# Patient Record
Sex: Male | Born: 1995 | Race: Black or African American | Hispanic: No | Marital: Single | Smoking: Never smoker
Health system: Southern US, Community
[De-identification: ages and names within clinical notes are randomized; demographics above are authoritative.]

## PROBLEM LIST (undated history)

## (undated) DIAGNOSIS — J45909 Unspecified asthma, uncomplicated: Secondary | ICD-10-CM

---

## 2018-03-20 ENCOUNTER — Ambulatory Visit: Payer: Self-pay | Admitting: Emergency Medicine

## 2018-06-19 ENCOUNTER — Telehealth: Payer: Medicaid Other | Admitting: Family

## 2018-06-19 DIAGNOSIS — J45909 Unspecified asthma, uncomplicated: Secondary | ICD-10-CM

## 2018-06-19 DIAGNOSIS — B9789 Other viral agents as the cause of diseases classified elsewhere: Secondary | ICD-10-CM

## 2018-06-19 DIAGNOSIS — J069 Acute upper respiratory infection, unspecified: Secondary | ICD-10-CM

## 2018-06-19 MED ORDER — BENZONATATE 100 MG PO CAPS: 100.0000 mg | ORAL_CAPSULE | Freq: Three times a day (TID) | ORAL | 0 refills | Status: DC | PRN

## 2018-06-19 MED ORDER — PREDNISONE 10 MG (21) PO TBPK: ORAL_TABLET | ORAL | 0 refills | Status: DC

## 2018-06-19 NOTE — Progress Notes (Signed)
We are sorry that you are not feeling well.  Here is how we plan to help!  I called patient to discuss SOB. States he has asthma and is using his inhaler that helps. He does not have a fever.   Approximately 5 minutes was spent documenting and reviewing patient's chart.    Based on your presentation I believe you most likely have A cough due to a virus.  This is called viral bronchitis and is best treated by rest, plenty of fluids and control of the cough.  You may use Ibuprofen or Tylenol as directed to help your symptoms.     In addition you may use A non-prescription cough medication called Robitussin DAC. Take 2 teaspoons every 8 hours or Delsym: take 2 teaspoons every 12 hours., A non-prescription cough medication called Mucinex DM: take 2 tablets every 12 hours. and A prescription cough medication called Tessalon Perles 100mg . You may take 1-2 capsules every 8 hours as needed for your cough.  Prednisone 10 mg daily for 6 days (see taper instructions below)    From your responses in the eVisit questionnaire you describe inflammation in the upper respiratory tract which is causing a significant cough.  This is commonly called Bronchitis and has four common causes:    Allergies  Viral Infections  Acid Reflux  Bacterial Infection Allergies, viruses and acid reflux are treated by controlling symptoms or eliminating the cause. An example might be a cough caused by taking certain blood pressure medications. You stop the cough by changing the medication. Another example might be a cough caused by acid reflux. Controlling the reflux helps control the cough.  USE OF BRONCHODILATOR ("RESCUE") INHALERS: There is a risk from using your bronchodilator too frequently.  The risk is that over-reliance on a medication which only relaxes the muscles surrounding the breathing tubes can reduce the effectiveness of medications prescribed to reduce swelling and congestion of the tubes themselves.  Although  you feel brief relief from the bronchodilator inhaler, your asthma may actually be worsening with the tubes becoming more swollen and filled with mucus.  This can delay other crucial treatments, such as oral steroid medications. If you need to use a bronchodilator inhaler daily, several times per day, you should discuss this with your provider.  There are probably better treatments that could be used to keep your asthma under control.     HOME CARE . Only take medications as instructed by your medical team. . Complete the entire course of an antibiotic. . Drink plenty of fluids and get plenty of rest. . Avoid close contacts especially the very young and the elderly . Cover your mouth if you cough or cough into your sleeve. . Always remember to wash your hands . A steam or ultrasonic humidifier can help congestion.   GET HELP RIGHT AWAY IF: . You develop worsening fever. . You become short of breath . You cough up blood. . Your symptoms persist after you have completed your treatment plan MAKE SURE YOU   Understand these instructions.  Will watch your condition.  Will get help right away if you are not doing well or get worse.  Your e-visit answers were reviewed by a board certified advanced clinical practitioner to complete your personal care plan.  Depending on the condition, your plan could have included both over the counter or prescription medications. If there is a problem please reply  once you have received a response from your provider. Your safety is important to  [place].  If you have drug allergies check your prescription carefully.    You can use MyChart to ask questions about today's visit, request a non-urgent call back, or ask for a work or school excuse for 24 hours related to this e-Visit. If it has been greater than 24 hours you will need to follow up with your provider, or enter a new e-Visit to address those concerns. You will get an e-mail in the next two days asking about  your experience.  I hope that your e-visit has been valuable and will speed your recovery. Thank you for using e-visits.

## 2018-06-20 ENCOUNTER — Telehealth: Payer: Self-pay | Admitting: Family

## 2018-06-20 DIAGNOSIS — R0600 Dyspnea, unspecified: Secondary | ICD-10-CM

## 2018-06-20 NOTE — Progress Notes (Signed)
Based on what you shared with me, I feel your condition warrants further evaluation and I recommend that you be seen for a face to face office visit.     NOTE: If you entered your credit card information for this eVisit, you will not be charged. You may see a "hold" on your card for the $35 but that hold will drop off and you will not have a charge processed.  If you are having a true medical emergency please call 911.  If you need an urgent face to face visit, [place] [facility] has four urgent care centers for your convenience.    PLEASE NOTE: THE [facility] LOC[facility] AND URGENT CARE CLINICS DO NOT HAVE THE TESTING FOR CORONAVIRUS COVID19 AVAILABLE.  IF YOU FEEL YOU NEED THIS TEST YOU MUST HAVE AN ORDER TO GO TO A TESTING LOCATION FROM YOUR PROVIDER OR FROM A SCREENING E-VISIT     [email or web address] to reserve your spot online an avoid wait times  [facility] [address], [address] [place], [place] [address] 8 am to 8 pm Monday-Friday 10 am to 4 pm Saturday-Sunday *Across the street from [employer]  [facility]  [address] [place], [address] 8 am to 5 pm Monday-Friday * In the [facility] on the [facility]   The following sites will take your insurance:  . [facility]  [phone] Get Driving Directions Find a Provider at this Location  [address] [place], [place] [address] . 10 am to 8 pm Monday-Friday . 12 pm to 8 pm Saturday-Sunday   . [facility]  [phone] Get Driving Directions Find a Provider at this Location  [address], [address] [place], [place] [address] . 8 am to 8 pm Monday-Friday . 9 am to 6 pm Saturday . 11 am to 6 pm Sunday   .  [facility] at [facility]  [phone] Get Driving Directions  [address] [place], [place] [address] . 8 am to 8 pm Monday-Friday . 8 am to 4 pm Saturday-Sunday   Your  e-visit answers were reviewed by a board certified advanced clinical practitioner to complete your personal care plan.  Thank you for using e-Visits. 

## 2018-06-25 ENCOUNTER — Ambulatory Visit: Payer: Self-pay | Admitting: Internal Medicine

## 2019-09-15 ENCOUNTER — Ambulatory Visit: Payer: Self-pay | Attending: Internal Medicine

## 2019-09-15 DIAGNOSIS — Z23 Encounter for immunization: Secondary | ICD-10-CM

## 2019-09-15 NOTE — Progress Notes (Signed)
   Covid-19 Vaccination Clinic  Name:  Geronimo Diliberto    MRN: [id number] DOB: 1996-03-20  09/15/2019  Mr. Jun was observed post Covid-19 immunization for 15 minutes without incident. He was provided with Vaccine Information Sheet and instruction to access the V-Safe system.   Mr. Panebianco was instructed to call 911 with any severe reactions post vaccine: Marland Kitchen Difficulty breathing  . Swelling of face and throat  . A fast heartbeat  . A bad rash all over body  . Dizziness and weakness   Immunizations Administered    Name Date Dose VIS Date Route   Pfizer COVID-19 Vaccine 09/15/2019 10:08 AM 0.3 mL 05/28/2018 Intramuscular   Manufacturer: [employer], [employer]   Lot: [id number]   NDC: [address]-3

## 2019-10-08 ENCOUNTER — Other Ambulatory Visit: Payer: Self-pay

## 2019-10-08 ENCOUNTER — Ambulatory Visit
Admission: RE | Admit: 2019-10-08 | Discharge: 2019-10-08 | Disposition: A | Discharge status: Home / Self Care | Payer: 59 | Source: Ambulatory Visit | Attending: Emergency Medicine | Admitting: Emergency Medicine

## 2019-10-08 ENCOUNTER — Inpatient Hospital Stay
Admission: RE | Admit: 2019-10-08 | Discharge: 2019-10-08 | Disposition: A | Discharge status: Home / Self Care | Payer: Medicaid Other | Source: Ambulatory Visit

## 2019-10-08 ENCOUNTER — Ambulatory Visit (INDEPENDENT_AMBULATORY_CARE_PROVIDER_SITE_OTHER): Payer: 59

## 2019-10-08 VITALS — BP 151/107 | HR 88 | Temp 100.3°F | Resp 18

## 2019-10-08 DIAGNOSIS — R05 Cough: Secondary | ICD-10-CM

## 2019-10-08 DIAGNOSIS — J069 Acute upper respiratory infection, unspecified: Secondary | ICD-10-CM | POA: Diagnosis not present

## 2019-10-08 DIAGNOSIS — J4521 Mild intermittent asthma with (acute) exacerbation: Secondary | ICD-10-CM | POA: Diagnosis not present

## 2019-10-08 HISTORY — DX: Unspecified asthma, uncomplicated: J45.909

## 2019-10-08 LAB — POC SARS CORONAVIRUS 2 AG -  ED: SARS Coronavirus 2 Ag: NEGATIVE

## 2019-10-08 MED ORDER — AEROCHAMBER PLUS FLO-VU MEDIUM MISC: 1.0000 | Freq: Once | 0 refills | Status: AC

## 2019-10-08 MED ORDER — BENZONATATE 100 MG PO CAPS: 100.0000 mg | ORAL_CAPSULE | Freq: Three times a day (TID) | ORAL | 0 refills | Status: DC

## 2019-10-08 MED ORDER — FLUTICASONE PROPIONATE 50 MCG/ACT NA SUSP: 1.0000 | Freq: Every day | NASAL | 0 refills | Status: DC

## 2019-10-08 MED ORDER — CETIRIZINE HCL 10 MG PO TABS: 10.0000 mg | ORAL_TABLET | Freq: Every day | ORAL | 0 refills | Status: DC

## 2019-10-08 MED ORDER — ALBUTEROL SULFATE HFA 108 (90 BASE) MCG/ACT IN AERS: 2.0000 | INHALATION_SPRAY | RESPIRATORY_TRACT | 0 refills | Status: AC | PRN

## 2019-10-08 MED ORDER — PREDNISONE 50 MG PO TABS: 50.0000 mg | ORAL_TABLET | Freq: Every day | ORAL | 0 refills | Status: DC

## 2019-10-08 NOTE — ED Triage Notes (Signed)
Pt c/o cough, nasal/chest congestion, and coughing up small amounts of BRB x10 x3 days

## 2019-10-08 NOTE — Discharge Instructions (Signed)

## 2019-10-08 NOTE — ED Provider Notes (Signed)
[facility]    CSN: [id number] Arrival date & time: 10/08/19  1141      History   Chief Complaint Chief Complaint  Patient presents with  . Cough    HPI Wayne Bird is a 24 y.o. male with history of asthma presenting for 3-day course of URI symptoms.  Patient provides history: Endorsing cough, nasal and chest congestion.  States he is coughed up a small amount of sputum: Yellow/clear with streaks of blood.  Denies gross hemoptysis, chest pain, palpitations.  No fever, thrashes, myalgias.  No known sick contacts.  Patient has been fully vaccinated for most 2 weeks now.  Has noticed increase in wheezing, for which he uses his albuterol inhaler: Denies ShOB.   Past Medical History:  Diagnosis Date  . Asthma     There are no problems to display for this patient.   History reviewed. No pertinent surgical history.     Home Medications    Prior to Admission medications   Medication Sig Start Date End Date Taking? Authorizing Provider  albuterol (VENTOLIN HFA) 108 (90 Base) MCG/ACT inhaler Inhale 2 puffs into the lungs every 4 (four) hours as needed for wheezing or shortness of breath. 10/08/19   Hall-Potvin, [place], PA-C  benzonatate (TESSALON) 100 MG capsule Take 1 capsule (100 mg total) by mouth every 8 (eight) hours. 10/08/19   Hall-Potvin, [place], PA-C  cetirizine (ZYRTEC ALLERGY) 10 MG tablet Take 1 tablet (10 mg total) by mouth daily. 10/08/19   Hall-Potvin, [place], PA-C  fluticasone (FLONASE) 50 MCG/ACT nasal spray Place 1 spray into both nostrils daily. 10/08/19   Hall-Potvin, [place], PA-C  predniSONE (DELTASONE) 50 MG tablet Take 1 tablet (50 mg total) by mouth daily with breakfast. 10/08/19   Hall-Potvin, [place], PA-C  Spacer/Aero-Holding Chambers (AEROCHAMBER PLUS FLO-VU MEDIUM) MISC 1 each by Other route once for 1 dose. 10/08/19 10/08/19  Hall-Potvin, [place], PA-C    Family History History reviewed. No pertinent family history.  Social  History Social History   Tobacco Use  . Smoking status: Never Smoker  . Smokeless tobacco: Never Used  Substance Use Topics  . Alcohol use: Not Currently  . Drug use: Not Currently     Allergies   Patient has no known allergies.   Review of Systems As per HPI   Physical Exam Triage Vital Signs ED Triage Vitals  Enc Vitals Group     BP      Pulse      Resp      Temp      Temp src      SpO2      Weight      Height      Head Circumference      Peak Flow      Pain Score      Pain Loc      Pain Edu?      Excl. in GC?    No data found.  Updated Vital Signs BP (!) 151/107 (BP Location: Left Arm)   Pulse 88   Temp 100.3 F (37.9 C) (Oral)   Resp 18   SpO2 92%   Visual Acuity Right Eye Distance:   Left Eye Distance:   Bilateral Distance:    Right Eye Near:   Left Eye Near:    Bilateral Near:     Physical Exam Constitutional:      General: He is not in acute distress.    Appearance: He is not toxic-appearing or diaphoretic.  HENT:  Head: Normocephalic and atraumatic.     Mouth/Throat:     Mouth: Mucous membranes are moist.     Pharynx: Oropharynx is clear.  Eyes:     General: No scleral icterus.    Conjunctiva/sclera: Conjunctivae normal.     Pupils: Pupils are equal, round, and reactive to light.  Neck:     Comments: Trachea midline, negative JVD Cardiovascular:     Rate and Rhythm: Normal rate and regular rhythm.  Pulmonary:     Effort: Pulmonary effort is normal. No respiratory distress.     Breath sounds: No wheezing or rales.     Comments: Decreased breath sounds bilaterally w/o prolonged expiratory phase Musculoskeletal:     Cervical back: Neck supple. No tenderness.  Lymphadenopathy:     Cervical: No cervical adenopathy.  Skin:    Capillary Refill: Capillary refill takes less than 2 seconds.     Coloration: Skin is not jaundiced or pale.     Findings: No rash.  Neurological:     Mental Status: He is alert and oriented to person,  place, and time.      UC Treatments / Results  Labs (all labs ordered are listed, but only abnormal results are displayed) Labs Reviewed  POC SARS CORONAVIRUS 2 AG -  ED - Normal  NOVEL CORONAVIRUS, NAA    EKG  Radiology DG Chest 2 View  Result Date: 10/08/2019 CLINICAL DATA:  Productive cough. EXAM: CHEST - 2 VIEW COMPARISON:  None. FINDINGS: The heart size and mediastinal contours are within normal limits. Both lungs are clear. The visualized skeletal structures are unremarkable. IMPRESSION: No active cardiopulmonary disease. Electronically Signed   By: Lupita Raider M.D.   On: 10/08/2019 12:58    Procedures Procedures (including critical care time)  Medications Ordered in UC Medications - No data to display  Initial Impression / Assessment and Plan / UC Course  I have reviewed the triage vital signs and the nursing notes.  Pertinent labs & imaging results that were available during my care of the patient were reviewed by me and considered in my medical decision making (see chart for details).     Patient afebrile, nontoxic, with SpO2 92%.  Rapid Covid negative, PCR pending.  Chest x-ray unremarkable.  Patient to quarantine until results are back.  We will treat supportively as outlined below.  Return precautions discussed, patient verbalized understanding and is agreeable to plan. Final Clinical Impressions(s) / UC Diagnoses   Final diagnoses:  URI with cough and congestion  Mild intermittent asthma with acute exacerbation     Discharge Instructions     Tessalon for cough. Start flonase, atrovent nasal spray for nasal congestion/drainage. You can use over the counter nasal saline rinse such as neti pot for nasal congestion. Keep hydrated, your urine should be clear to pale yellow in color. Tylenol/motrin for fever and pain. Monitor for any worsening of symptoms, chest pain, shortness of breath, wheezing, swelling of the throat, go to the emergency department for  further evaluation needed.     ED Prescriptions    Medication Sig Dispense Auth. Provider   albuterol (VENTOLIN HFA) 108 (90 Base) MCG/ACT inhaler Inhale 2 puffs into the lungs every 4 (four) hours as needed for wheezing or shortness of breath. 18 g Hall-Potvin, [place], PA-C   Spacer/Aero-Holding Chambers (AEROCHAMBER PLUS FLO-VU MEDIUM) MISC 1 each by Other route once for 1 dose. 1 each Hall-Potvin, [place], PA-C   fluticasone (FLONASE) 50 MCG/ACT nasal spray Place 1 spray into  both nostrils daily. 16 g Hall-Potvin, [place], PA-C   cetirizine (ZYRTEC ALLERGY) 10 MG tablet Take 1 tablet (10 mg total) by mouth daily. 30 tablet Hall-Potvin, [place], PA-C   predniSONE (DELTASONE) 50 MG tablet Take 1 tablet (50 mg total) by mouth daily with breakfast. 5 tablet Hall-Potvin, [place], PA-C   benzonatate (TESSALON) 100 MG capsule Take 1 capsule (100 mg total) by mouth every 8 (eight) hours. 21 capsule Hall-Potvin, [place], PA-C     PDMP not reviewed this encounter.   Hall-Potvin, [place], [place] 10/08/19 1407

## 2019-10-09 LAB — SARS-COV-2, NAA 2 DAY TAT

## 2019-10-09 LAB — NOVEL CORONAVIRUS, NAA: SARS-CoV-2, NAA: NOT DETECTED

## 2019-12-16 ENCOUNTER — Other Ambulatory Visit: Payer: 59

## 2021-08-24 ENCOUNTER — Observation Stay (HOSPITAL_COMMUNITY)
Admission: EM | Admit: 2021-08-24 | Discharge: 2021-08-25 | Disposition: A | Discharge status: Home / Self Care | Payer: 59 | Attending: Student | Admitting: Student

## 2021-08-24 ENCOUNTER — Other Ambulatory Visit: Payer: Self-pay

## 2021-08-24 ENCOUNTER — Emergency Department (HOSPITAL_COMMUNITY): Payer: 59

## 2021-08-24 ENCOUNTER — Encounter (HOSPITAL_COMMUNITY): Payer: Self-pay

## 2021-08-24 DIAGNOSIS — D72829 Elevated white blood cell count, unspecified: Secondary | ICD-10-CM | POA: Insufficient documentation

## 2021-08-24 DIAGNOSIS — Z91010 Allergy to peanuts: Secondary | ICD-10-CM | POA: Insufficient documentation

## 2021-08-24 DIAGNOSIS — J9601 Acute respiratory failure with hypoxia: Principal | ICD-10-CM | POA: Insufficient documentation

## 2021-08-24 DIAGNOSIS — Z79899 Other long term (current) drug therapy: Secondary | ICD-10-CM | POA: Insufficient documentation

## 2021-08-24 DIAGNOSIS — J4541 Moderate persistent asthma with (acute) exacerbation: Secondary | ICD-10-CM

## 2021-08-24 DIAGNOSIS — R739 Hyperglycemia, unspecified: Secondary | ICD-10-CM | POA: Insufficient documentation

## 2021-08-24 DIAGNOSIS — J45901 Unspecified asthma with (acute) exacerbation: Secondary | ICD-10-CM | POA: Insufficient documentation

## 2021-08-24 DIAGNOSIS — E876 Hypokalemia: Secondary | ICD-10-CM | POA: Insufficient documentation

## 2021-08-24 DIAGNOSIS — R000 Tachycardia, unspecified: Secondary | ICD-10-CM | POA: Insufficient documentation

## 2021-08-24 MED ORDER — MAGNESIUM SULFATE 2 GM/50ML IV SOLN
2.0000 g | Freq: Once | INTRAVENOUS | Status: AC
  Administered 2021-08-24: 2 g via INTRAVENOUS
  Filled 2021-08-24: qty 50

## 2021-08-24 MED ORDER — ALBUTEROL SULFATE HFA 108 (90 BASE) MCG/ACT IN AERS
2.0000 | INHALATION_SPRAY | RESPIRATORY_TRACT | Status: DC | PRN
  Administered 2021-08-24: 2 via RESPIRATORY_TRACT
  Filled 2021-08-24: qty 6.7

## 2021-08-24 MED ORDER — ALBUTEROL SULFATE (2.5 MG/3ML) 0.083% IN NEBU
10.0000 mg/h | INHALATION_SOLUTION | Freq: Once | RESPIRATORY_TRACT | Status: AC
  Administered 2021-08-24: 10 mg/h via RESPIRATORY_TRACT
  Filled 2021-08-24: qty 3

## 2021-08-24 MED ORDER — IPRATROPIUM BROMIDE 0.02 % IN SOLN
0.5000 mg | Freq: Once | RESPIRATORY_TRACT | Status: AC
  Administered 2021-08-24: 0.5 mg via RESPIRATORY_TRACT
  Filled 2021-08-24: qty 2.5

## 2021-08-24 MED ORDER — IPRATROPIUM-ALBUTEROL 0.5-2.5 (3) MG/3ML IN SOLN
6.0000 mL | Freq: Once | RESPIRATORY_TRACT | Status: AC
  Administered 2021-08-24: 6 mL via RESPIRATORY_TRACT
  Filled 2021-08-24: qty 6

## 2021-08-24 MED ORDER — METHYLPREDNISOLONE SODIUM SUCC 125 MG IJ SOLR
125.0000 mg | Freq: Once | INTRAMUSCULAR | Status: AC
  Administered 2021-08-24: 125 mg via INTRAVENOUS
  Filled 2021-08-24: qty 2

## 2021-08-24 NOTE — ED Provider Notes (Addendum)
[facility]-EMERGENCY DEPT Provider Note  CSN: [id number] Arrival date & time: 08/24/21 2056  Chief Complaint(s) Asthma, Wheezing, and Shortness of Breath  HPI Wayne Bird is a 26 y.o. male with PMH asthma who presents the emergency department for evaluation of wheezing and shortness of breath.  Patient states that he has had some mild shortness of breath in last 2 to 3 days but today his shortness of breath worsened significantly.  He used his home albuterol with a spacer with no relief.  He arrives with accessory muscle use, tachypnea and hypoxia.  Denies abdominal pain, nausea, vomiting, fever or other systemic symptoms.   Past Medical History Past Medical History:  Diagnosis Date   Asthma    There are no problems to display for this patient.  Home Medication(s) Prior to Admission medications   Medication Sig Start Date End Date Taking? Authorizing Provider  albuterol (VENTOLIN HFA) 108 (90 Base) MCG/ACT inhaler Inhale 2 puffs into the lungs every 4 (four) hours as needed for wheezing or shortness of breath. 10/08/19   Hall-Potvin, [place], PA-C  benzonatate (TESSALON) 100 MG capsule Take 1 capsule (100 mg total) by mouth every 8 (eight) hours. 10/08/19   Hall-Potvin, [place], PA-C  cetirizine (ZYRTEC ALLERGY) 10 MG tablet Take 1 tablet (10 mg total) by mouth daily. 10/08/19   Hall-Potvin, [place], PA-C  fluticasone (FLONASE) 50 MCG/ACT nasal spray Place 1 spray into both nostrils daily. 10/08/19   Hall-Potvin, [place], PA-C  predniSONE (DELTASONE) 50 MG tablet Take 1 tablet (50 mg total) by mouth daily with breakfast. 10/08/19   Hall-Potvin, [place], [place]                                                                                                                                    Past Surgical History History reviewed. No pertinent surgical history. Family History History reviewed. No pertinent family history.  Social History Social History   Tobacco Use    Smoking status: Never   Smokeless tobacco: Never  Substance Use Topics   Alcohol use: Not Currently   Drug use: Not Currently   Allergies Patient has no known allergies.  Review of Systems Review of Systems  Respiratory:  Positive for cough, shortness of breath and wheezing.    Physical Exam Vital Signs  I have reviewed the triage vital signs BP (!) 170/99   Pulse 96   Temp 98.3 F (36.8 C) (Oral)   Resp 14   SpO2 91%   Physical Exam Constitutional:      General: He is not in acute distress.    Appearance: Normal appearance.  HENT:     Head: Normocephalic and atraumatic.     Nose: No congestion or rhinorrhea.  Eyes:     General:        Right eye: No discharge.        Left eye: No discharge.     Extraocular Movements:  Extraocular movements intact.     Pupils: Pupils are equal, round, and reactive to light.  Cardiovascular:     Rate and Rhythm: Normal rate and regular rhythm.     Heart sounds: No murmur heard. Pulmonary:     Effort: Tachypnea, accessory muscle usage and respiratory distress present.     Breath sounds: Wheezing present. No rales.  Abdominal:     General: There is no distension.     Tenderness: There is no abdominal tenderness.  Musculoskeletal:        General: Normal range of motion.     Cervical back: Normal range of motion.  Skin:    General: Skin is warm and dry.  Neurological:     General: No focal deficit present.     Mental Status: He is alert.    ED Results and Treatments Labs (all labs ordered are listed, but only abnormal results are displayed) Labs Reviewed - No data to display                                                                                                                        Radiology DG Chest Portable 1 View  Result Date: 08/24/2021 CLINICAL DATA:  Dyspnea, wheezing, shortness of breath. EXAM: PORTABLE CHEST 1 VIEW COMPARISON:  10/08/2019. FINDINGS: The heart size and mediastinal contours are within normal  limits. There is hyperinflation of the lungs. Both lungs are clear. The visualized skeletal structures are unremarkable. IMPRESSION: Hyperinflation of the lungs with acute infiltrate. Electronically Signed   By: Thornell Sartorius M.D.   On: 08/24/2021 21:50    Pertinent labs & imaging results that were available during my care of the patient were reviewed by me and considered in my medical decision making (see MDM for details).  Medications Ordered in ED Medications  albuterol (VENTOLIN HFA) 108 (90 Base) MCG/ACT inhaler 2 puff (2 puffs Inhalation Given 08/24/21 2121)  magnesium sulfate IVPB 2 g 50 mL (has no administration in time range)  albuterol (PROVENTIL) (2.5 MG/3ML) 0.083% nebulizer solution (has no administration in time range)  ipratropium (ATROVENT) nebulizer solution 0.5 mg (has no administration in time range)  ipratropium-albuterol (DUONEB) 0.5-2.5 (3) MG/3ML nebulizer solution 6 mL (6 mLs Nebulization Given 08/24/21 2121)  methylPREDNISolone sodium succinate (SOLU-MEDROL) 125 mg/2 mL injection 125 mg (125 mg Intravenous Given 08/24/21 2127)  Procedures .Critical Care Performed by: Glendora ScoreKommor, Grasiela Jonsson, MD Authorized by: Glendora ScoreKommor, Eliga Arvie, MD   Critical care provider statement:    Critical care time (minutes):  30   Critical care was necessary to treat or prevent imminent or life-threatening deterioration of the following conditions:  Respiratory failure   Critical care was time spent personally by me on the following activities:  Development of treatment plan with patient or surrogate, discussions with consultants, evaluation of patient's response to treatment, examination of patient, ordering and review of laboratory studies, ordering and review of radiographic studies, ordering and performing treatments and interventions, pulse oximetry, re-evaluation of  patient's condition and review of old charts  (including critical care time)  Medical Decision Making / ED Course   This patient presents to the ED for concern of shortness of breath, wheezing this involves an extensive number of treatment options, and is a complaint that carries with it a high risk of complications and morbidity.  The differential diagnosis includes asthma exacerbation, pneumonia, aspirated foreign body  MDM: Seen in the emergency room for evaluation of shortness of breath.  Physical exam reveals patient in respiratory distress with tachypnea, accessory muscle use and wheezing.  ECG with sinus tachycardia, chest x-ray unremarkable.  Patient given 2 back-to-back DuoNebs, 125 methylprednisolone and on reevaluation patient improved but still with significant wheezing.  1 hour continuous albuterol and 1 additional Atrovent ordered and patient signed out to oncoming provider.  Magnesium also ordered.  If patient symptoms not improved after this treatment, patient will require admission for albuterol treatments every 4 hours.   Additional history obtained: -Additional history obtained from girlfriend -External records from outside source obtained and reviewed including: Chart review including previous notes, labs, imaging, consultation notes   Lab Tests: -I ordered, reviewed, and interpreted labs.   The pertinent results include:   Labs Reviewed - No data to display    EKG   EKG Interpretation  Date/Time:  Wednesday Aug 24 2021 21:12:52 EDT Ventricular Rate:  118 PR Interval:  154 QRS Duration: 84 QT Interval:  320 QTC Calculation: 451 R Axis:   73 Text Interpretation: Sinus tachycardia Confirmed by Javier Gell ([id number]) on 08/24/2021 10:37:57 PM         Imaging Studies ordered: I ordered imaging studies including chest x-ray I independently visualized and interpreted imaging. I agree with the radiologist interpretation   Medicines ordered and prescription  drug management: Meds ordered this encounter  Medications   albuterol (VENTOLIN HFA) 108 (90 Base) MCG/ACT inhaler 2 puff   ipratropium-albuterol (DUONEB) 0.5-2.5 (3) MG/3ML nebulizer solution 6 mL   methylPREDNISolone sodium succinate (SOLU-MEDROL) 125 mg/2 mL injection 125 mg    IV methylprednisolone will be converted to either a q12h or q24h frequency with the same total daily dose (TDD).  Ordered Dose: 1 to 125 mg TDD; convert to: TDD q24h.  Ordered Dose: 126 to 250 mg TDD; convert to: TDD div q12h.  Ordered Dose: >250 mg TDD; DAW.   magnesium sulfate IVPB 2 g 50 mL   albuterol (PROVENTIL) (2.5 MG/3ML) 0.083% nebulizer solution   ipratropium (ATROVENT) nebulizer solution 0.5 mg    -I have reviewed the patients home medicines and have made adjustments as needed  Critical interventions Multiple DuoNebs, oxygen supplementation, steroids    Cardiac Monitoring: The patient was maintained on a cardiac monitor.  I personally viewed and interpreted the cardiac monitored which showed an underlying rhythm of: Tachycardia  Social Determinants of Health:  Factors impacting patients care include: none  Reevaluation: After the interventions noted above, I reevaluated the patient and found that they have :improved  Co morbidities that complicate the patient evaluation  Past Medical History:  Diagnosis Date   Asthma       Dispostion: I considered admission for this patient, and disposition will be pending completion of continuous albuterol therapy, please see provider signout note for continuation of work-up     Final Clinical Impression(s) / ED Diagnoses Final diagnoses:  None     @PCDICTATION @    , MD 08/24/21 2241    08/26/21, MD 08/24/21 2336

## 2021-08-24 NOTE — ED Triage Notes (Signed)
Pt reports with wheezing and SHOB x 1 hr ago. Pt tried to use his rescue inhaler with no relief.

## 2021-08-25 DIAGNOSIS — J4541 Moderate persistent asthma with (acute) exacerbation: Secondary | ICD-10-CM

## 2021-08-25 DIAGNOSIS — J9601 Acute respiratory failure with hypoxia: Secondary | ICD-10-CM | POA: Diagnosis present

## 2021-08-25 DIAGNOSIS — J4521 Mild intermittent asthma with (acute) exacerbation: Secondary | ICD-10-CM

## 2021-08-25 DIAGNOSIS — E876 Hypokalemia: Secondary | ICD-10-CM

## 2021-08-25 DIAGNOSIS — R739 Hyperglycemia, unspecified: Secondary | ICD-10-CM

## 2021-08-25 DIAGNOSIS — R030 Elevated blood-pressure reading, without diagnosis of hypertension: Secondary | ICD-10-CM

## 2021-08-25 DIAGNOSIS — J45901 Unspecified asthma with (acute) exacerbation: Secondary | ICD-10-CM | POA: Diagnosis present

## 2021-08-25 LAB — COMPREHENSIVE METABOLIC PANEL
ALT: 16 U/L (ref 0–44)
AST: 23 U/L (ref 15–41)
Albumin: 4.2 g/dL (ref 3.5–5.0)
Alkaline Phosphatase: 61 U/L (ref 38–126)
Anion gap: 10 (ref 5–15)
BUN: 12 mg/dL (ref 6–20)
CO2: 22 mmol/L (ref 22–32)
Calcium: 8.9 mg/dL (ref 8.9–10.3)
Chloride: 108 mmol/L (ref 98–111)
Creatinine, Ser: 1.19 mg/dL (ref 0.61–1.24)
GFR, Estimated: >60 mL/min (ref >60)
Glucose, Bld: 120 mg/dL — ABNORMAL HIGH (ref 70–99)
Potassium: 3.3 mmol/L — ABNORMAL LOW (ref 3.5–5.1)
Sodium: 140 mmol/L (ref 135–145)
Total Bilirubin: 0.8 mg/dL (ref 0.3–1.2)
Total Protein: 7 g/dL (ref 6.5–8.1)

## 2021-08-25 LAB — CBC
HCT: 40.3 % (ref 39.0–52.0)
Hemoglobin: 13.5 g/dL (ref 13.0–17.0)
MCH: 30.8 pg (ref 26.0–34.0)
MCHC: 33.5 g/dL (ref 30.0–36.0)
MCV: 91.8 fL (ref 80.0–100.0)
Platelets: 183 10*3/uL (ref 150–400)
RBC: 4.39 MIL/uL (ref 4.22–5.81)
RDW: 14.2 % (ref 11.5–15.5)
WBC: 7.4 10*3/uL (ref 4.0–10.5)
nRBC: 0 % (ref 0.0–0.2)

## 2021-08-25 LAB — CBC WITH DIFFERENTIAL/PLATELET
Abs Immature Granulocytes: 0.03 10*3/uL (ref 0.00–0.07)
Basophils Absolute: 0 10*3/uL (ref 0.0–0.1)
Basophils Relative: 0 %
Eosinophils Absolute: 0.1 10*3/uL (ref 0.0–0.5)
Eosinophils Relative: 1 %
HCT: 42.6 % (ref 39.0–52.0)
Hemoglobin: 14.1 g/dL (ref 13.0–17.0)
Immature Granulocytes: 0 %
Lymphocytes Relative: 8 %
Lymphs Abs: 0.8 10*3/uL (ref 0.7–4.0)
MCH: 31.3 pg (ref 26.0–34.0)
MCHC: 33.1 g/dL (ref 30.0–36.0)
MCV: 94.5 fL (ref 80.0–100.0)
Monocytes Absolute: 0.2 10*3/uL (ref 0.1–1.0)
Monocytes Relative: 2 %
Neutro Abs: 9.4 10*3/uL — ABNORMAL HIGH (ref 1.7–7.7)
Neutrophils Relative %: 89 %
Platelets: 167 10*3/uL (ref 150–400)
RBC: 4.51 MIL/uL (ref 4.22–5.81)
RDW: 14.1 % (ref 11.5–15.5)
WBC: 10.6 10*3/uL — ABNORMAL HIGH (ref 4.0–10.5)
nRBC: 0 % (ref 0.0–0.2)

## 2021-08-25 LAB — BASIC METABOLIC PANEL
Anion gap: 11 (ref 5–15)
BUN: 13 mg/dL (ref 6–20)
CO2: 22 mmol/L (ref 22–32)
Calcium: 9.7 mg/dL (ref 8.9–10.3)
Chloride: 108 mmol/L (ref 98–111)
Creatinine, Ser: 1.1 mg/dL (ref 0.61–1.24)
GFR, Estimated: >60 mL/min (ref >60)
Glucose, Bld: 166 mg/dL — ABNORMAL HIGH (ref 70–99)
Potassium: 4 mmol/L (ref 3.5–5.1)
Sodium: 141 mmol/L (ref 135–145)

## 2021-08-25 LAB — BLOOD GAS, VENOUS
Acid-Base Excess: 0.5 mmol/L (ref 0.0–2.0)
Bicarbonate: 27 mmol/L (ref 20.0–28.0)
Drawn by: 59225
O2 Saturation: 62.5 %
Patient temperature: 37
pCO2, Ven: 50 mmHg (ref 44–60)
pH, Ven: 7.34 (ref 7.25–7.43)
pO2, Ven: 38 mmHg (ref 32–45)

## 2021-08-25 LAB — HIV ANTIBODY (ROUTINE TESTING W REFLEX): HIV Screen 4th Generation wRfx: NONREACTIVE

## 2021-08-25 MED ORDER — PREDNISONE 50 MG PO TABS: 50.0000 mg | ORAL_TABLET | Freq: Every day | ORAL | 0 refills | Status: DC

## 2021-08-25 MED ORDER — ACETAMINOPHEN 325 MG PO TABS
650.0000 mg | ORAL_TABLET | Freq: Four times a day (QID) | ORAL | Status: DC | PRN
Start: 2021-08-25 — End: 2021-08-25

## 2021-08-25 MED ORDER — ENOXAPARIN SODIUM 40 MG/0.4ML IJ SOSY
40.0000 mg | PREFILLED_SYRINGE | INTRAMUSCULAR | Status: DC
  Administered 2021-08-25: 40 mg via SUBCUTANEOUS
  Filled 2021-08-25: qty 0.4

## 2021-08-25 MED ORDER — ONDANSETRON HCL 4 MG/2ML IJ SOLN: 4.0000 mg | Freq: Four times a day (QID) | INTRAMUSCULAR | Status: DC | PRN

## 2021-08-25 MED ORDER — FLUTICASONE-SALMETEROL 250-50 MCG/ACT IN AEPB: 1.0000 | INHALATION_SPRAY | Freq: Two times a day (BID) | RESPIRATORY_TRACT | 1 refills | Status: AC

## 2021-08-25 MED ORDER — METHYLPREDNISOLONE SODIUM SUCC 40 MG IJ SOLR
40.0000 mg | Freq: Two times a day (BID) | INTRAMUSCULAR | Status: DC
  Administered 2021-08-25: 40 mg via INTRAVENOUS
  Filled 2021-08-25: qty 1

## 2021-08-25 MED ORDER — ACETAMINOPHEN 650 MG RE SUPP
650.0000 mg | Freq: Four times a day (QID) | RECTAL | Status: DC | PRN
Start: 2021-08-25 — End: 2021-08-25

## 2021-08-25 MED ORDER — ALBUTEROL SULFATE (2.5 MG/3ML) 0.083% IN NEBU: 2.5000 mg | INHALATION_SOLUTION | RESPIRATORY_TRACT | Status: DC | PRN

## 2021-08-25 MED ORDER — MOMETASONE FURO-FORMOTEROL FUM 200-5 MCG/ACT IN AERO
2.0000 | INHALATION_SPRAY | Freq: Two times a day (BID) | RESPIRATORY_TRACT | Status: DC
  Administered 2021-08-25: 2 via RESPIRATORY_TRACT
  Filled 2021-08-25: qty 8.8

## 2021-08-25 MED ORDER — ALBUTEROL SULFATE (2.5 MG/3ML) 0.083% IN NEBU
5.0000 mg | INHALATION_SOLUTION | Freq: Once | RESPIRATORY_TRACT | Status: AC
  Administered 2021-08-25: 5 mg via RESPIRATORY_TRACT
  Filled 2021-08-25: qty 6

## 2021-08-25 MED ORDER — ONDANSETRON HCL 4 MG PO TABS: 4.0000 mg | ORAL_TABLET | Freq: Four times a day (QID) | ORAL | Status: DC | PRN

## 2021-08-25 MED ORDER — UMECLIDINIUM BROMIDE 62.5 MCG/ACT IN AEPB
1.0000 | INHALATION_SPRAY | Freq: Every day | RESPIRATORY_TRACT | Status: DC
  Administered 2021-08-25: 1 via RESPIRATORY_TRACT
  Filled 2021-08-25: qty 7

## 2021-08-25 NOTE — Progress Notes (Signed)
Nutrition Brief Note  Consult received per COPD protocol for assessment of nutrition requirements and status. MST score of 0.  Wt Readings from Last 15 Encounters:  08/25/21 91.7 kg    Body mass index is 26.67 kg/m. Patient meets criteria for overweight status based on current BMI. Skin WDL.  Patient admitted for acute asthma exacerbation. He is currently listed as observation status.  Current diet order is Regular and patient ate 95% of breakfast this AM. Labs and medications reviewed.   No nutrition interventions warranted at this time. If nutrition issues arise, please re-consult RD.       Trenton Gammon, MS, RD, LDN Registered Dietitian II Inpatient Clinical Nutrition RD pager # and on-call/weekend pager # available in [facility]

## 2021-08-25 NOTE — Progress Notes (Signed)
SATURATION QUALIFICATIONS: (This note is used to comply with regulatory documentation for home oxygen)  Patient Saturations on Room Air at Rest = 96%  Patient Saturations on Room Air while Ambulating = 90%  Patient Saturations on NA Liters of oxygen while Ambulating = NA  Please briefly explain why patient needs home oxygen: 

## 2021-08-25 NOTE — Discharge Summary (Signed)
Physician Discharge Summary  Anthem Butikofer [id number] DOB: 1996/01/13 DOA: 08/24/2021  PCP: Loraine Leriche., MD  Admit date: 08/24/2021 Discharge date: 08/25/2021 Admitted From: Home Disposition: Home Recommendations for Outpatient Follow-up:  Follow ups as below. Consider referral to pulmonology Please follow up on the following pending results: None  Home Health: Not indicated  Equipment/Devices: Not indicated  Discharge Condition: Stable CODE STATUS: Full code  Follow-up Information     Loraine Leriche., MD. Schedule an appointment as soon as possible for a visit in 1 week(s).   Specialty: Internal Medicine Contact information: [place] [address] [phone]                 Hospital course 26 year old M with PMH of mild intermittent asthma and eczema presenting with shortness of breath, wheeze and dry cough and admitted for acute hypoxic respiratory failure due to asthma exacerbation.  Chest x-ray with hyperinflation but no infiltrate.  Blood work including CBC and CMP without significant finding other than mild hypokalemia and mild leukocytosis.  He has no eosinophilia on differential.  Patient was started on IV steroid, nebulizers and antitussive, and admitted for further care.  Later in the day, patient's respiratory symptoms improved.  He was liberated off oxygen.  Ambulated on room air and maintain appropriate saturation without respiratory distress.  He is discharged on p.o. prednisone, Advair 250/50 with as needed albuterol.  Recommend follow-up with PCP in 1 to 2 weeks.  May consider referral to pulmonology or asthma and allergist specialist  See individual problem list below for more on hospital course.  Problems addressed during this hospitalization Acute hypoxic respiratory failure due to asthma exacerbation-respiratory failure resolved.  Asthma exacerbation improved.  Discharged on prednisone, Advair and as needed  albuterol.  Eczema-continue home medications.  Hypokalemia: Resolved.  Leukocytosis: Likely demargination.  Resolved.  Mild hyperglycemia: Likely due to steroid.  Elevated blood pressure reading/sinus tachycardia: Likely stress mediated.  Improved.  Vital signs Vitals:   08/25/21 0251 08/25/21 0301 08/25/21 0758 08/25/21 1038  BP: (!) 157/80   140/73  Pulse: (!) 104   66  Temp: 97.7 F (36.5 C)     Resp: 20   16  Height:  6\' 1"  (1.854 m)    Weight:  91.7 kg    SpO2: 99%  93% 93%  TempSrc: Oral     BMI (Calculated):  26.68       Discharge exam  GENERAL: No apparent distress.  Nontoxic. HEENT: MMM.  Vision and hearing grossly intact.  NECK: Supple.  No apparent JVD.  RESP:  No IWOB.  Fair aeration bilaterally. CVS:  RRR. Heart sounds normal.  ABD/GI/GU: BS+. Abd soft, NTND.  MSK/EXT:  Moves extremities. No apparent deformity. No edema.  SKIN: no apparent skin lesion or wound NEURO: Awake and alert. Oriented appropriately.  No apparent focal neuro deficit. PSYCH: Calm. Normal affect.   Discharge Instructions Discharge Instructions     Diet - low sodium heart healthy   Complete by: As directed    Discharge instructions   Complete by: As directed    It has been a pleasure taking care of you!  You were hospitalized due to asthma exacerbation.  Your symptoms improved with treatment.  We are discharging you on prednisone and daily inhaler in addition to your albuterol.  Please use your medications as prescribed.  Use your Advair twice daily regardless of improvement in your breathing.  Albuterol is only as needed.  Follow-up with your  primary care doctor in 1 to 2 weeks or sooner if needed.   Take care,   Increase activity slowly   Complete by: As directed       Allergies as of 08/25/2021       Reactions   Other Anaphylaxis   Peanut (diagnostic) Anaphylaxis, Anxiety, Itching, Shortness Of Breath        Medication List     TAKE these medications     albuterol 108 (90 Base) MCG/ACT inhaler Commonly known as: VENTOLIN HFA Inhale 2 puffs into the lungs every 4 (four) hours as needed for wheezing or shortness of breath.   fluticasone-salmeterol 250-50 MCG/ACT Aepb Commonly known as: Advair Diskus Inhale 1 puff into the lungs in the morning and at bedtime.   predniSONE 50 MG tablet Commonly known as: DELTASONE Take 1 tablet (50 mg total) by mouth daily. Start taking on: Aug 26, 2021        Consultations: None  Procedures/Studies:   DG Chest Portable 1 View  Addendum Date: 08/25/2021   ADDENDUM REPORT: 08/25/2021 05:42 ADDENDUM: Voice recognition error noted in the impression section of this report. The impression sentence should read as follows: "Hyperinflation of the lungs WITHOUT acute infiltrate." Electronically Signed   By: Misty Stanley M.D.   On: 08/25/2021 05:42   Result Date: 08/25/2021 CLINICAL DATA:  Dyspnea, wheezing, shortness of breath. EXAM: PORTABLE CHEST 1 VIEW COMPARISON:  10/08/2019. FINDINGS: The heart size and mediastinal contours are within normal limits. There is hyperinflation of the lungs. Both lungs are clear. The visualized skeletal structures are unremarkable. IMPRESSION: Hyperinflation of the lungs with acute infiltrate. Electronically Signed: By: Brett Fairy M.D. On: 08/24/2021 21:50       The results of significant diagnostics from this hospitalization (including imaging, microbiology, ancillary and laboratory) are listed below for reference.     Microbiology: No results found for this or any previous visit (from the past 240 hour(s)).   Labs:  CBC: Recent Labs  Lab 08/24/21 2240 08/25/21 0355  WBC 10.6* 7.4  NEUTROABS 9.4*  --   HGB 14.1 13.5  HCT 42.6 40.3  MCV 94.5 91.8  PLT 167 183   BMP &GFR Recent Labs  Lab 08/24/21 2240 08/25/21 0355  NA 140 141  K 3.3* 4.0  CL 108 108  CO2 22 22  GLUCOSE 120* 166*  BUN 12 13  CREATININE 1.19 1.10  CALCIUM 8.9 9.7   Estimated  Creatinine Clearance: 115 mL/min (by C-G formula based on SCr of 1.1 mg/dL). Liver & Pancreas: Recent Labs  Lab 08/24/21 2240  AST 23  ALT 16  ALKPHOS 61  BILITOT 0.8  PROT 7.0  ALBUMIN 4.2   No results for input(s): LIPASE, AMYLASE in the last 168 hours. No results for input(s): AMMONIA in the last 168 hours. Diabetic: No results for input(s): HGBA1C in the last 72 hours. No results for input(s): GLUCAP in the last 168 hours. Cardiac Enzymes: No results for input(s): CKTOTAL, CKMB, CKMBINDEX, TROPONINI in the last 168 hours. No results for input(s): PROBNP in the last 8760 hours. Coagulation Profile: No results for input(s): INR, PROTIME in the last 168 hours. Thyroid Function Tests: No results for input(s): TSH, T4TOTAL, FREET4, T3FREE, THYROIDAB in the last 72 hours. Lipid Profile: No results for input(s): CHOL, HDL, LDLCALC, TRIG, CHOLHDL, LDLDIRECT in the last 72 hours. Anemia Panel: No results for input(s): VITAMINB12, FOLATE, FERRITIN, TIBC, IRON, RETICCTPCT in the last 72 hours. Urine analysis: No results found for: COLORURINE,  APPEARANCEUR, LABSPEC, PHURINE, GLUCOSEU, HGBUR, BILIRUBINUR, KETONESUR, PROTEINUR, UROBILINOGEN, NITRITE, LEUKOCYTESUR Sepsis Labs: Invalid input(s): PROCALCITONIN, LACTICIDVEN   Time coordinating discharge: 35 minutes  SIGNED:  Mercy Riding, MD  [facility] 08/25/2021, 3:43 PM

## 2021-08-25 NOTE — TOC Initial Note (Signed)
Transition of Care [facility]) - Initial/Assessment Note    Patient Details  Name: Azarion Hove MRN: [id number] Date of Birth: [birth year]  Transition of Care [facility]) CM/SW Contact:    Golda Acre, RN Phone Number: 08/25/2021, 7:45 AM  Clinical Narrative:                  Transition of Care [facility]) Screening Note   Patient Details  Name: Nasser Ku Date of Birth: 1995-06-22   Transition of Care [facility]) CM/SW Contact:    Golda Acre, RN Phone Number: 08/25/2021, 7:45 AM    Transition of Care [facility]) has reviewed patient and no TOC needs have been identified at this time. We will continue to monitor patient advancement through interdisciplinary progression rounds. If new patient transition needs arise, please place a TOC consult.    Expected Discharge Plan: Home/Self Care Barriers to Discharge: No Barriers Identified   Patient Goals and CMS Choice Patient states their goals for this hospitalization and ongoing recovery are:: to go home CMS Medicare.gov Compare Post Acute Care list provided to:: Patient    Expected Discharge Plan and Services Expected Discharge Plan: Home/Self Care   Discharge Planning Services: CM Consult   Living arrangements for the past 2 months: Apartment                                      Prior Living Arrangements/Services Living arrangements for the past 2 months: Apartment Lives with:: Self Patient language and need for interpreter reviewed:: Yes Do you feel safe going back to the place where you live?: Yes            Criminal Activity/Legal Involvement Pertinent to Current Situation/Hospitalization: No - Comment as needed  Activities of Daily Living Home Assistive Devices/Equipment: None ADL Screening (condition at time of admission) Patient's cognitive ability adequate to safely complete daily activities?: No Is the patient deaf or have difficulty hearing?: No Does the patient have difficulty seeing, even  when wearing glasses/contacts?: No Does the patient have difficulty concentrating, remembering, or making decisions?: No Patient able to express need for assistance with ADLs?: Yes Does the patient have difficulty dressing or bathing?: No Independently performs ADLs?: Yes (appropriate for developmental age) Does the patient have difficulty walking or climbing stairs?: No Weakness of Legs: None Weakness of Arms/Hands: None  Permission Sought/Granted                  Emotional Assessment Appearance:: Appears stated age     Orientation: : Oriented to Self, Oriented to Place, Oriented to  Time, Oriented to Situation Alcohol / Substance Use: Not Applicable Psych Involvement: No (comment)  Admission diagnosis:  Asthma exacerbation [J45.901] Patient Active Problem List   Diagnosis Date Noted   Asthma exacerbation 08/25/2021   Acute respiratory failure with hypoxia (HCC) 08/25/2021   PCP:  Cheron Schaumann., MD Pharmacy:   [facility] [id number] - [place], [place] - [address] [place] [address] Phone: [phone] Fax: [phone]     Social Determinants of Health (SDOH) Interventions    Readmission Risk Interventions     View : No data to display.

## 2021-08-25 NOTE — Progress Notes (Signed)
Pt discharged to home, instructions reviewed with pt . Acknowledged understanding of instructions. SRP, RN

## 2021-08-25 NOTE — ED Provider Notes (Signed)
Patient care assumed at 2300.  Patient with history of asthma here for evaluation of increased shortness of breath.  Care assumed pending reassessment of lung exam, labs.  Labs with mild hypokalemia.  On reassessment patient with borderline oxygen sats, varying from 88 to 91% on room air at rest.  Leg exam with occasional wheezes, no respiratory distress.  Given his borderline hypoxia, persistent wheezing plan to admit for ongoing treatment.  Medicine consulted for admission.   Tilden Fossa, MD 08/25/21 [phone]

## 2021-08-25 NOTE — H&P (Signed)
History and Physical    Patient: Wayne Bird IBB:[id number] DOB: 1995-07-15 DOA: 08/24/2021 DOS: the patient was seen and examined on 08/25/2021 PCP: [facility] Internal Medicine - [facility] Dr. Alwyn Ren it looks like, I have set as PCP in system). Patient coming from: Home  Chief Complaint:  Chief Complaint  Patient presents with   Asthma   Wheezing   Shortness of Breath   HPI: Wayne Bird is a 26 y.o. male with medical history significant of Asthma including hospitalization for asthma in past.  Pt presents to ED with c/o respiratory distress, wheezing, and asthma exacerbation.  Pt does not take a chronic daily preventative inhaler.  Home albuterol not effective per pt.  Symptoms are severe (with new onset hypoxia and O2 requirement in ED).  Symptoms constant and persistent.  Symptoms finally improved some after ED treatments.  Review of Systems: As mentioned in the history of present illness. All other systems reviewed and are negative. Past Medical History:  Diagnosis Date   Asthma    History reviewed. No pertinent surgical history. Social History:  reports that he has never smoked. He has never used smokeless tobacco. He reports that he does not currently use alcohol. He reports that he does not currently use drugs.  Allergies  Allergen Reactions   Other Anaphylaxis   Peanut (Diagnostic) Anaphylaxis, Anxiety, Itching and Shortness Of Breath    History reviewed. No pertinent family history.  Prior to Admission medications   Medication Sig Start Date End Date Taking? Authorizing Provider  albuterol (VENTOLIN HFA) 108 (90 Base) MCG/ACT inhaler Inhale 2 puffs into the lungs every 4 (four) hours as needed for wheezing or shortness of breath. 10/08/19  Yes [place], [place], [place]    Physical Exam: Vitals:   08/25/21 0230 08/25/21 0238 08/25/21 0251 08/25/21 0301  BP:   (!) 157/80   Pulse:   (!) 104   Resp:   20   Temp:  98.2 F (36.8  C) 97.7 F (36.5 C)   TempSrc:  Oral Oral   SpO2: 95%  99%   Weight:    91.7 kg  Height:    6\' 1"  (1.854 m)   Constitutional: NAD, calm, comfortable Eyes: PERRL, lids and conjunctivae normal ENMT: Mucous membranes are moist. Posterior pharynx clear of any exudate or lesions.Normal dentition.  Neck: normal, supple, no masses, no thyromegaly Respiratory: No respiratory distress, lungs actually pretty clear at this point and I dont hear much wheezing (though this is after heroic doses of albuterol, magnesium, steroids, etc in the ED). Cardiovascular: Regular rate and rhythm, no murmurs / rubs / gallops. No extremity edema. 2+ pedal pulses. No carotid bruits.  Abdomen: no tenderness, no masses palpated. No hepatosplenomegaly. Bowel sounds positive.  Musculoskeletal: no clubbing / cyanosis. No joint deformity upper and lower extremities. Good ROM, no contractures. Normal muscle tone.  Skin: no rashes, lesions, ulcers. No induration Neurologic: CN 2-12 grossly intact. Sensation intact, DTR normal. Strength 5/5 in all 4.  Psychiatric: Normal judgment and insight. Alert and oriented x 3. Normal mood.   Data Reviewed:     CXR = hyperinflation of lungs, I presume they ment to say WITHOUT acute infiltrate (instead of WITH).  Confirmed with radiologist on call: this appears to be a dictation error, no infiltrate in lungs.   Assessment and Plan: * Asthma exacerbation Pt with acute asthma exacerbation. Improved after heroic doses of albuterol in ED (as well as steroids, Mg, etc). Is NOT currently on a daily  home preventative. COPD / Asthma pathway Continue solumedrol at 40mg  IV BID for now PRN SABA Scheduled LABA/LAMA and INH steroid Tele monitor Cont pulse ox  Acute respiratory failure with hypoxia (HCC) Patient has acute respiratory failure with hypoxia due to having a new oxygen requirement.  That is the patient has a PaO2 < 60 (pulse Ox < 90%) on room air.  New O2 requirement due to  asthma exacerbation. Cont pulse ox.      Advance Care Planning:   Code Status: Full Code  Consults: None  Family Communication: Family at bedside  Severity of Illness: The appropriate patient status for this patient is OBSERVATION. Observation status is judged to be reasonable and necessary in order to provide the required intensity of service to ensure the patient's safety. The patient's presenting symptoms, physical exam findings, and initial radiographic and laboratory data in the context of their medical condition is felt to place them at decreased risk for further clinical deterioration. Furthermore, it is anticipated that the patient will be medically stable for discharge from the hospital within 2 midnights of admission.   Author: ., DO 08/25/2021 5:32 AM  For on call review [email or web address].

## 2021-08-25 NOTE — Assessment & Plan Note (Addendum)
Patient has acute respiratory failure with hypoxia due to having a new oxygen requirement.  That is the patient has a PaO2 < 60 (pulse Ox < 90%) on room air.  New O2 requirement due to asthma exacerbation. Cont pulse ox.

## 2021-08-25 NOTE — TOC Transition Note (Signed)
Transition of Care [facility]) - CM/SW Discharge Note   Patient Details  Name: Haidar Muse MRN: [id number] Date of Birth: 29-Jan-1996  Transition of Care [facility]) CM/SW Contact:  Golda Acre, RN Phone Number: 08/25/2021, 11:26 AM   Clinical Narrative:    Dcd to home with no toc needs   Final next level of care: Home/Self Care Barriers to Discharge: No Barriers Identified   Patient Goals and CMS Choice Patient states their goals for this hospitalization and ongoing recovery are:: to go home CMS Medicare.gov Compare Post Acute Care list provided to:: Patient    Discharge Placement                       Discharge Plan and Services   Discharge Planning Services: CM Consult                                 Social Determinants of Health (SDOH) Interventions     Readmission Risk Interventions     View : No data to display.

## 2021-08-25 NOTE — Progress Notes (Signed)
OT Cancellation Note  Patient Details Name: Ryo Klang MRN: [id number] DOB: [birth year]   Cancelled Treatment:    Reason Eval/Treat Not Completed: OT screened, no needs identified, will sign off. Patient is young and independent in room. OT evaluation not indicated.   Layden Caterino L Shauna Bodkins 08/25/2021, 10:14 AM

## 2021-08-25 NOTE — Progress Notes (Signed)
PT Cancellation Note  Patient Details Name: Wayne Bird MRN: [id number] DOB: 24-Jul-1995   Cancelled Treatment:    Reason Eval/Treat Not Completed: PT screened, no needs identified, will sign off (Per OT discussion with patient he is mobilizing independently and has no skilled acute PT needs.)   Wynn Maudlin, DPT Acute Rehabilitation Services Office [phone] Pager [phone]  08/25/21 10:25 AM

## 2021-08-25 NOTE — ED Notes (Signed)
ED TO INPATIENT HANDOFF REPORT  Name/Age/Gender Wayne Bird 26 y.o. male  Code Status    Code Status Orders  (From admission, onward)           Start     Ordered   08/25/21 0128  Full code  Continuous        08/25/21 0129           Code Status History     This patient has a current code status but no historical code status.       Home/SNF/Other Home  Chief Complaint Asthma exacerbation [J45.901]  Level of Care/Admitting Diagnosis ED Disposition     ED Disposition  Admit   Condition  --   Comment  Hospital Area: [facility] [[id number]]  Level of Care: Progressive [102]  Admit to Progressive based on following criteria: RESPIRATORY PROBLEMS hypoxemic/hypercapnic respiratory failure that is responsive to NIPPV (BiPAP) or High Flow Nasal Cannula (6-80 lpm). Frequent assessment/intervention, no > Q2 hrs < Q4 hrs, to maintain oxygenation and pulmonary hygiene.  May place patient in observation at [facility] or [facility] if equivalent level of care is available:: No  Covid Evaluation: Asymptomatic - no recent exposure (last 10 days) testing not required  Diagnosis: Asthma exacerbation [086761]  Admitting Physician: Hillary Bow [[id number]]  Attending Physician: Hillary Bow [phone]          Medical History Past Medical History:  Diagnosis Date   Asthma     Allergies Allergies  Allergen Reactions   Other Anaphylaxis   Peanut (Diagnostic) Anaphylaxis, Anxiety, Itching and Shortness Of Breath    IV Location/Drains/Wounds Patient Lines/Drains/Airways Status     Active Line/Drains/Airways     Name Placement date Placement time Site Days   Peripheral IV 08/24/21 22 G Anterior;Distal;Right;Upper Arm 08/24/21  2126  Arm  1            Labs/Imaging Results for orders placed or performed during the hospital encounter of 08/24/21 (from the past 48 hour(s))  Comprehensive metabolic panel     Status: Abnormal   Collection  Time: 08/24/21 10:40 PM  Result Value Ref Range   Sodium 140 135 - 145 mmol/L   Potassium 3.3 (L) 3.5 - 5.1 mmol/L   Chloride 108 98 - 111 mmol/L   CO2 22 22 - 32 mmol/L   Glucose, Bld 120 (H) 70 - 99 mg/dL    Comment: Glucose reference range applies only to samples taken after fasting for at least 8 hours.   BUN 12 6 - 20 mg/dL   Creatinine, Ser 1.24 0.61 - 1.24 mg/dL   Calcium 8.9 8.9 - 58.0 mg/dL   Total Protein 7.0 6.5 - 8.1 g/dL   Albumin 4.2 3.5 - 5.0 g/dL   AST 23 15 - 41 U/L   ALT 16 0 - 44 U/L   Alkaline Phosphatase 61 38 - 126 U/L   Total Bilirubin 0.8 0.3 - 1.2 mg/dL   GFR, Estimated >99 >83 mL/min    Comment: (NOTE) Calculated using the CKD-EPI Creatinine Equation (2021)    Anion gap 10 5 - 15    Comment: Performed at [facility], [address], [place], [place] [address]  CBC with Differential     Status: Abnormal   Collection Time: 08/24/21 10:40 PM  Result Value Ref Range   WBC 10.6 (H) 4.0 - 10.5 K/uL   RBC 4.51 4.22 - 5.81 MIL/uL   Hemoglobin 14.1 13.0 - 17.0 g/dL  HCT 42.6 39.0 - 52.0 %   MCV 94.5 80.0 - 100.0 fL   MCH 31.3 26.0 - 34.0 pg   MCHC 33.1 30.0 - 36.0 g/dL   RDW 68.1 27.5 - 17.0 %   Platelets 167 150 - 400 K/uL   nRBC 0.0 0.0 - 0.2 %   Neutrophils Relative % 89 %   Neutro Abs 9.4 (H) 1.7 - 7.7 K/uL   Lymphocytes Relative 8 %   Lymphs Abs 0.8 0.7 - 4.0 K/uL   Monocytes Relative 2 %   Monocytes Absolute 0.2 0.1 - 1.0 K/uL   Eosinophils Relative 1 %   Eosinophils Absolute 0.1 0.0 - 0.5 K/uL   Basophils Relative 0 %   Basophils Absolute 0.0 0.0 - 0.1 K/uL   Immature Granulocytes 0 %   Abs Immature Granulocytes 0.03 0.00 - 0.07 K/uL    Comment: Performed at [facility], [address] [place], [place] [address]  Blood gas, venous (at [facility] and [facility], not at [facility])     Status: None   Collection Time: 08/24/21 11:56 PM  Result Value Ref Range   pH, Ven 7.34 7.25 - 7.43   pCO2, Ven 50 44 - 60 mmHg   pO2,  Ven 38 32 - 45 mmHg   Bicarbonate 27.0 20.0 - 28.0 mmol/L   Acid-Base Excess 0.5 0.0 - 2.0 mmol/L   O2 Saturation 62.5 %   Patient temperature 37.0    Drawn by [id number]     Comment: Performed at [facility], [address], [place], [place] [address]   DG Chest Portable 1 View  Result Date: 08/24/2021 CLINICAL DATA:  Dyspnea, wheezing, shortness of breath. EXAM: PORTABLE CHEST 1 VIEW COMPARISON:  10/08/2019. FINDINGS: The heart size and mediastinal contours are within normal limits. There is hyperinflation of the lungs. Both lungs are clear. The visualized skeletal structures are unremarkable. IMPRESSION: Hyperinflation of the lungs with acute infiltrate. Electronically Signed   By: Thornell Sartorius M.D.   On: 08/24/2021 21:50    Pending Labs Unresulted Labs (From admission, onward)     Start     Ordered   08/25/21 0500  CBC  Tomorrow morning,   R        08/25/21 0129   08/25/21 0500  Basic metabolic panel  Tomorrow morning,   R        08/25/21 0129   08/25/21 0128  HIV Antibody (routine testing w rflx)  (HIV Antibody (Routine testing w reflex) panel)  Once,   R        08/25/21 0129            Vitals/Pain Today's Vitals   08/25/21 0130 08/25/21 0145 08/25/21 0200 08/25/21 0215  BP: 133/82  109/61   Pulse: 98 98 94 95  Resp:   13 18  Temp:      TempSrc:      SpO2: 93% 93% 90%   PainSc:        Isolation Precautions No active isolations  Medications Medications  enoxaparin (LOVENOX) injection 40 mg (has no administration in time range)  acetaminophen (TYLENOL) tablet 650 mg (has no administration in time range)    Or  acetaminophen (TYLENOL) suppository 650 mg (has no administration in time range)  ondansetron (ZOFRAN) tablet 4 mg (has no administration in time range)    Or  ondansetron (ZOFRAN) injection 4 mg (has no administration in time range)  methylPREDNISolone sodium succinate (SOLU-MEDROL) 40 mg/mL injection 40 mg (  has no administration in time  range)  mometasone-formoterol (DULERA) 200-5 MCG/ACT inhaler 2 puff (2 puffs Inhalation Not Given 08/25/21 0159)  albuterol (PROVENTIL) (2.5 MG/3ML) 0.083% nebulizer solution 2.5 mg (has no administration in time range)  umeclidinium bromide (INCRUSE ELLIPTA) 62.5 MCG/ACT 1 puff (has no administration in time range)  ipratropium-albuterol (DUONEB) 0.5-2.5 (3) MG/3ML nebulizer solution 6 mL (6 mLs Nebulization Given 08/24/21 2121)  methylPREDNISolone sodium succinate (SOLU-MEDROL) 125 mg/2 mL injection 125 mg (125 mg Intravenous Given 08/24/21 2127)  magnesium sulfate IVPB 2 g 50 mL (2 g Intravenous New Bag/Given 08/24/21 2341)  albuterol (PROVENTIL) (2.5 MG/3ML) 0.083% nebulizer solution (10 mg/hr Nebulization Given 08/24/21 2245)  ipratropium (ATROVENT) nebulizer solution 0.5 mg (0.5 mg Nebulization Given 08/24/21 2245)  albuterol (PROVENTIL) (2.5 MG/3ML) 0.083% nebulizer solution 5 mg (5 mg Nebulization Given 08/25/21 0150)    Mobility walks

## 2021-08-25 NOTE — Plan of Care (Signed)

## 2021-08-25 NOTE — Assessment & Plan Note (Signed)
Pt with acute asthma exacerbation. Improved after heroic doses of albuterol in ED (as well as steroids, Mg, etc). Is NOT currently on a daily home preventative. 1. COPD / Asthma pathway 2. Continue solumedrol at 40mg  IV BID for now 3. PRN SABA 4. Scheduled LABA/LAMA and INH steroid 5. Tele monitor 6. Cont pulse ox

## 2022-06-16 ENCOUNTER — Encounter (HOSPITAL_BASED_OUTPATIENT_CLINIC_OR_DEPARTMENT_OTHER): Payer: Self-pay | Admitting: Emergency Medicine

## 2022-06-16 ENCOUNTER — Other Ambulatory Visit: Payer: Self-pay

## 2022-06-16 ENCOUNTER — Emergency Department (HOSPITAL_BASED_OUTPATIENT_CLINIC_OR_DEPARTMENT_OTHER): Payer: BC Managed Care – PPO

## 2022-06-16 ENCOUNTER — Emergency Department (HOSPITAL_BASED_OUTPATIENT_CLINIC_OR_DEPARTMENT_OTHER)
Admission: EM | Admit: 2022-06-16 | Discharge: 2022-06-17 | Disposition: A | Discharge status: Home / Self Care | Payer: BC Managed Care – PPO | Attending: Emergency Medicine | Admitting: Emergency Medicine

## 2022-06-16 DIAGNOSIS — R0602 Shortness of breath: Secondary | ICD-10-CM | POA: Diagnosis present

## 2022-06-16 DIAGNOSIS — Z7951 Long term (current) use of inhaled steroids: Secondary | ICD-10-CM | POA: Insufficient documentation

## 2022-06-16 DIAGNOSIS — J4541 Moderate persistent asthma with (acute) exacerbation: Secondary | ICD-10-CM | POA: Diagnosis not present

## 2022-06-16 LAB — CBC WITH DIFFERENTIAL/PLATELET
Abs Immature Granulocytes: 0.01 10*3/uL (ref 0.00–0.07)
Basophils Absolute: 0 10*3/uL (ref 0.0–0.1)
Basophils Relative: 0 %
Eosinophils Absolute: 0.4 10*3/uL (ref 0.0–0.5)
Eosinophils Relative: 5 %
HCT: 42.5 % (ref 39.0–52.0)
Hemoglobin: 14.4 g/dL (ref 13.0–17.0)
Immature Granulocytes: 0 %
Lymphocytes Relative: 47 %
Lymphs Abs: 3.2 10*3/uL (ref 0.7–4.0)
MCH: 29.1 pg (ref 26.0–34.0)
MCHC: 33.9 g/dL (ref 30.0–36.0)
MCV: 85.9 fL (ref 80.0–100.0)
Monocytes Absolute: 0.5 10*3/uL (ref 0.1–1.0)
Monocytes Relative: 7 %
Neutro Abs: 2.9 10*3/uL (ref 1.7–7.7)
Neutrophils Relative %: 41 %
Platelets: 247 10*3/uL (ref 150–400)
RBC: 4.95 MIL/uL (ref 4.22–5.81)
RDW: 14.6 % (ref 11.5–15.5)
WBC: 7 10*3/uL (ref 4.0–10.5)
nRBC: 0 % (ref 0.0–0.2)

## 2022-06-16 MED ORDER — ALBUTEROL SULFATE (2.5 MG/3ML) 0.083% IN NEBU
INHALATION_SOLUTION | RESPIRATORY_TRACT | Status: AC
  Filled 2022-06-16: qty 12

## 2022-06-16 MED ORDER — METHYLPREDNISOLONE SODIUM SUCC 125 MG IJ SOLR
125.0000 mg | Freq: Once | INTRAMUSCULAR | Status: AC
  Administered 2022-06-16: 125 mg via INTRAVENOUS
  Filled 2022-06-16: qty 2

## 2022-06-16 MED ORDER — MAGNESIUM SULFATE 50 % IJ SOLN
2.0000 g | Freq: Once | INTRAMUSCULAR | Status: AC
  Administered 2022-06-16: 2 g via INTRAVENOUS
  Filled 2022-06-16: qty 4

## 2022-06-16 MED ORDER — ALBUTEROL SULFATE (2.5 MG/3ML) 0.083% IN NEBU
10.0000 mg/h | INHALATION_SOLUTION | RESPIRATORY_TRACT | Status: AC
  Administered 2022-06-16: 10 mg/h via RESPIRATORY_TRACT

## 2022-06-16 NOTE — ED Triage Notes (Signed)
Pt in with sob, expiratory wheezing. Has hx of asthma, last took albuterol inhaler on arrival to ED. Denies any pain or recent sickness. Sats 88%, able to speak in short sentences, pursed lip breathing noted.

## 2022-06-16 NOTE — ED Notes (Signed)
3LNC applied, sats now improved to 95%

## 2022-06-16 NOTE — ED Provider Notes (Signed)
[facility] Provider Note   CSN: [id number] Arrival date & time: 06/16/22  2317     History  Chief Complaint  Patient presents with   Shortness of Breath   Wheezing    Wayne Bird is a 27 y.o. male.  Patient is a 27 year old male with history of asthma.  Patient presenting today with a several day history of wheezing, cough, and shortness of breath.  He has been using his inhaler with little relief.  Patient was seen by his primary doctor earlier today and underwent testing for COVID, RSV, and flu, all of which were negative.  He presents tonight with worsening breathing and wheezing.  He denies to me he is having any chest pain.  He denies any leg swelling.  He denies any other medical history.  The history is provided by the patient.  Shortness of Breath Severity:  Moderate Onset quality:  Gradual Duration:  2 days Timing:  Constant Progression:  Worsening Chronicity:  Recurrent Relieved by:  Nothing Worsened by:  Nothing Associated symptoms: wheezing   Wheezing Associated symptoms: shortness of breath        Home Medications Prior to Admission medications   Medication Sig Start Date End Date Taking? Authorizing Provider  albuterol (VENTOLIN HFA) 108 (90 Base) MCG/ACT inhaler Inhale 2 puffs into the lungs every 4 (four) hours as needed for wheezing or shortness of breath. 10/08/19   Hall-Potvin, [place], PA-C  fluticasone-salmeterol (ADVAIR DISKUS) 250-50 MCG/ACT AEPB Inhale 1 puff into the lungs in the morning and at bedtime. 08/25/21   Mercy Riding, MD  predniSONE (DELTASONE) 50 MG tablet Take 1 tablet (50 mg total) by mouth daily. 08/26/21   Mercy Riding, MD      Allergies    Other and Peanut (diagnostic)    Review of Systems   Review of Systems  Respiratory:  Positive for shortness of breath and wheezing.   All other systems reviewed and are negative.   Physical Exam Updated Vital Signs BP (!) 151/86   Pulse  (!) 118   Resp (!) 24   Wt 108.9 kg   SpO2 (!) 88%   BMI 31.66 kg/m  Physical Exam Vitals and nursing note reviewed.  Constitutional:      General: He is not in acute distress.    Appearance: He is well-developed. He is not diaphoretic.  HENT:     Head: Normocephalic and atraumatic.  Cardiovascular:     Rate and Rhythm: Normal rate and regular rhythm.     Heart sounds: No murmur heard.    No friction rub.  Pulmonary:     Effort: Pulmonary effort is normal. No respiratory distress.     Breath sounds: Examination of the right-middle field reveals wheezing. Examination of the left-middle field reveals wheezing. Wheezing present. No rhonchi or rales.     Comments: There are expiratory wheezes bilaterally.  Patient is in mild respiratory distress. Abdominal:     General: Bowel sounds are normal. There is no distension.     Palpations: Abdomen is soft.     Tenderness: There is no abdominal tenderness.  Musculoskeletal:        General: Normal range of motion.     Cervical back: Normal range of motion and neck supple.  Skin:    General: Skin is warm and dry.  Neurological:     Mental Status: He is alert and oriented to person, place, and time.     Coordination: Coordination  normal.     ED Results / Procedures / Treatments   Labs (all labs ordered are listed, but only abnormal results are displayed) Labs Reviewed - No data to display  EKG EKG Interpretation  Date/Time:  Friday June 16 2022 23:26:21 EDT Ventricular Rate:  116 PR Interval:  151 QRS Duration: 86 QT Interval:  319 QTC Calculation: 442 R Axis:   92 Text Interpretation: Sinus tachycardia DELAYED R WAVE PROGRESSION No significant change since 08/24/2021 Confirmed by Veryl Speak [phone]) on 06/16/2022 11:31:03 PM  Radiology No results found.  Procedures Procedures  {Document cardiac monitor, telemetry assessment procedure when appropriate:1}  Medications Ordered in ED Medications  albuterol (PROVENTIL)  (2.5 MG/3ML) 0.083% nebulizer solution (10 mg/hr Nebulization New Bag/Given 06/16/22 2334)  albuterol (PROVENTIL) (2.5 MG/3ML) 0.083% nebulizer solution (  Not Given 06/16/22 2334)  methylPREDNISolone sodium succinate (SOLU-MEDROL) 125 mg/2 mL injection 125 mg (has no administration in time range)  magnesium sulfate (IV Push/IM) injection 2 g (has no administration in time range)    ED Course/ Medical Decision Making/ A&P   {   Click here for ABCD2, HEART and other calculatorsREFRESH Note before signing :1}                          Medical Decision Making Amount and/or Complexity of Data Reviewed Labs: ordered. Radiology: ordered.  Risk Prescription drug management.   ***  {Document critical care time when appropriate:1} {Document review of labs and clinical decision tools ie heart score, Chads2Vasc2 etc:1}  {Document your independent review of radiology images, and any outside records:1} {Document your discussion with family members, caretakers, and with consultants:1} {Document social determinants of health affecting pt's care:1} {Document your decision making why or why not admission, treatments were needed:1} Final Clinical Impression(s) / ED Diagnoses Final diagnoses:  None    Rx / DC Orders ED Discharge Orders     None

## 2022-06-17 ENCOUNTER — Emergency Department (HOSPITAL_BASED_OUTPATIENT_CLINIC_OR_DEPARTMENT_OTHER): Payer: BC Managed Care – PPO

## 2022-06-17 LAB — BASIC METABOLIC PANEL
Anion gap: 9 (ref 5–15)
BUN: 10 mg/dL (ref 6–20)
CO2: 23 mmol/L (ref 22–32)
Calcium: 9.3 mg/dL (ref 8.9–10.3)
Chloride: 107 mmol/L (ref 98–111)
Creatinine, Ser: 1.05 mg/dL (ref 0.61–1.24)
GFR, Estimated: >60 mL/min (ref >60)
Glucose, Bld: 114 mg/dL — ABNORMAL HIGH (ref 70–99)
Potassium: 3.6 mmol/L (ref 3.5–5.1)
Sodium: 139 mmol/L (ref 135–145)

## 2022-06-17 MED ORDER — GUAIFENESIN-CODEINE 100-10 MG/5ML PO SOLN
10.0000 mL | Freq: Once | ORAL | Status: AC
  Administered 2022-06-17: 10 mL via ORAL
  Filled 2022-06-17: qty 10

## 2022-06-17 MED ORDER — GUAIFENESIN-CODEINE 100-10 MG/5ML PO SOLN: 10.0000 mL | Freq: Four times a day (QID) | ORAL | 0 refills | Status: AC | PRN

## 2022-06-17 MED ORDER — IPRATROPIUM-ALBUTEROL 0.5-2.5 (3) MG/3ML IN SOLN
3.0000 mL | Freq: Once | RESPIRATORY_TRACT | Status: AC
  Administered 2022-06-17: 3 mL via RESPIRATORY_TRACT
  Filled 2022-06-17: qty 3

## 2022-06-17 NOTE — ED Notes (Signed)
PO refreshment given. Non-productive cough noted. V/s stable on 2 L [place]

## 2022-06-17 NOTE — ED Notes (Signed)
Xray delayed due to coughing. Coughing improved after meds. Xray now at bedside.

## 2022-06-17 NOTE — ED Notes (Signed)
Breathing treatment resumed. Pt WOB improved-Saturation 95%.

## 2022-06-17 NOTE — Discharge Instructions (Signed)
Continue your albuterol nebulizer every 4 hours as needed.  Begin taking the steroid that was previously prescribed by your primary doctor.  Return to the emergency department if symptoms worsen or change.

## 2022-06-17 NOTE — ED Notes (Signed)
Ambulated in hall with pulse ox. Sat reads 91% while walking- on RA. Steady on feet.

## 2022-06-17 NOTE — Progress Notes (Signed)
Have tried 3x to get the chest xray. Unable to get xray because the patient cannot stop coughing. Have asked the nurse to call when the patient can have it done.

## 2022-06-17 NOTE — ED Notes (Signed)
Difficulty tolerating breathing treatment. Pull's mask off. De-sat to 86% on RA. Placed on  @ 6LPM. RTT and MD at bedside.

## 2022-08-12 ENCOUNTER — Emergency Department (HOSPITAL_BASED_OUTPATIENT_CLINIC_OR_DEPARTMENT_OTHER)
Admission: EM | Admit: 2022-08-12 | Discharge: 2022-08-12 | Disposition: A | Discharge status: Home / Self Care | Payer: 59 | Attending: Emergency Medicine | Admitting: Emergency Medicine

## 2022-08-12 ENCOUNTER — Other Ambulatory Visit: Payer: Self-pay

## 2022-08-12 ENCOUNTER — Encounter (HOSPITAL_BASED_OUTPATIENT_CLINIC_OR_DEPARTMENT_OTHER): Payer: Self-pay

## 2022-08-12 DIAGNOSIS — Z7951 Long term (current) use of inhaled steroids: Secondary | ICD-10-CM | POA: Diagnosis not present

## 2022-08-12 DIAGNOSIS — J4541 Moderate persistent asthma with (acute) exacerbation: Secondary | ICD-10-CM | POA: Diagnosis not present

## 2022-08-12 DIAGNOSIS — Z7952 Long term (current) use of systemic steroids: Secondary | ICD-10-CM | POA: Insufficient documentation

## 2022-08-12 DIAGNOSIS — R0602 Shortness of breath: Secondary | ICD-10-CM | POA: Diagnosis present

## 2022-08-12 MED ORDER — IPRATROPIUM-ALBUTEROL 0.5-2.5 (3) MG/3ML IN SOLN: 3.0000 mL | Freq: Once | RESPIRATORY_TRACT | Status: AC

## 2022-08-12 MED ORDER — PREDNISONE 10 MG (21) PO TBPK: ORAL_TABLET | ORAL | 0 refills | Status: AC

## 2022-08-12 MED ORDER — IPRATROPIUM-ALBUTEROL 0.5-2.5 (3) MG/3ML IN SOLN
RESPIRATORY_TRACT | Status: AC
  Administered 2022-08-12: 3 mL via RESPIRATORY_TRACT
  Filled 2022-08-12: qty 3

## 2022-08-12 MED ORDER — ALBUTEROL SULFATE (2.5 MG/3ML) 0.083% IN NEBU
INHALATION_SOLUTION | RESPIRATORY_TRACT | Status: AC
  Administered 2022-08-12: 2.5 mg via RESPIRATORY_TRACT
  Filled 2022-08-12: qty 3

## 2022-08-12 MED ORDER — ALBUTEROL SULFATE (2.5 MG/3ML) 0.083% IN NEBU: 2.5000 mg | INHALATION_SOLUTION | Freq: Once | RESPIRATORY_TRACT | Status: AC

## 2022-08-12 MED ORDER — MAGNESIUM SULFATE 2 GM/50ML IV SOLN
2.0000 g | Freq: Once | INTRAVENOUS | Status: AC
  Administered 2022-08-12: 2 g via INTRAVENOUS
  Filled 2022-08-12: qty 50

## 2022-08-12 MED ORDER — ALBUTEROL SULFATE (2.5 MG/3ML) 0.083% IN NEBU
2.5000 mg | INHALATION_SOLUTION | Freq: Once | RESPIRATORY_TRACT | Status: AC
  Administered 2022-08-12: 2.5 mg via RESPIRATORY_TRACT
  Filled 2022-08-12: qty 3

## 2022-08-12 MED ORDER — IPRATROPIUM-ALBUTEROL 0.5-2.5 (3) MG/3ML IN SOLN
3.0000 mL | Freq: Once | RESPIRATORY_TRACT | Status: AC
  Administered 2022-08-12: 3 mL via RESPIRATORY_TRACT
  Filled 2022-08-12: qty 3

## 2022-08-12 MED ORDER — METHYLPREDNISOLONE SODIUM SUCC 125 MG IJ SOLR
125.0000 mg | Freq: Once | INTRAMUSCULAR | Status: AC
  Administered 2022-08-12: 125 mg via INTRAVENOUS
  Filled 2022-08-12: qty 2

## 2022-08-12 NOTE — ED Notes (Signed)
ED Provider at bedside. 

## 2022-08-12 NOTE — ED Triage Notes (Signed)
Patient states he feels he is having a panic attack, used nebulizer x 2 in the last 2 hours, inhaler x 4 in the last 4 hours. Respirations even, unlabored, no acute distress noted during triage.

## 2022-08-12 NOTE — ED Provider Notes (Signed)
[place] EMERGENCY DEPARTMENT AT [facility]  Provider Note  CSN: [id number] Arrival date & time: 08/12/22 0044  History Chief Complaint  Patient presents with   Asthma    Wayne Bird is a 27 y.o. male with history of moderate persistent asthma on Trelegy, with frequent exacerbations reports several hours of increased SOB and wheezing, used nebulizer at home with minimal improvement. No recent fever. He vapes.    Home Medications Prior to Admission medications   Medication Sig Start Date End Date Taking? Authorizing Provider  predniSONE (STERAPRED UNI-PAK 21 TAB) 10 MG (21) TBPK tablet 10mg  Tabs, 6 day taper. Use as directed 08/12/22  Yes Pollyann Savoy, MD  albuterol (VENTOLIN HFA) 108 (90 Base) MCG/ACT inhaler Inhale 2 puffs into the lungs every 4 (four) hours as needed for wheezing or shortness of breath. 10/08/19   Hall-Potvin, [place], PA-C  fluticasone-salmeterol (ADVAIR DISKUS) 250-50 MCG/ACT AEPB Inhale 1 puff into the lungs in the morning and at bedtime. 08/25/21   Almon Hercules, MD  guaiFENesin-codeine 100-10 MG/5ML syrup Take 10 mLs by mouth every 6 (six) hours as needed for cough. 06/17/22   Geoffery Lyons, MD     Allergies    Other and Peanut (diagnostic)   Review of Systems   Review of Systems Please see HPI for pertinent positives and negatives  Physical Exam BP 131/80   Pulse (!) 106   Temp 97.7 F (36.5 C) (Oral)   Resp 20   Ht 6\' 1"  (1.854 m)   Wt 127 kg   SpO2 94%   BMI 36.94 kg/m   Physical Exam Vitals and nursing note reviewed.  Constitutional:      Appearance: Normal appearance.  HENT:     Head: Normocephalic and atraumatic.     Nose: Nose normal.     Mouth/Throat:     Mouth: Mucous membranes are moist.  Eyes:     Extraocular Movements: Extraocular movements intact.     Conjunctiva/sclera: Conjunctivae normal.  Cardiovascular:     Rate and Rhythm: Normal rate.  Pulmonary:     Breath sounds: Wheezing present.      Comments: Decreased air movement. No increased WOB or retractions Abdominal:     General: Abdomen is flat.     Palpations: Abdomen is soft.     Tenderness: There is no abdominal tenderness.  Musculoskeletal:        General: No swelling. Normal range of motion.     Cervical back: Neck supple.  Skin:    General: Skin is warm and dry.  Neurological:     General: No focal deficit present.     Mental Status: He is alert.  Psychiatric:        Mood and Affect: Mood normal.     ED Results / Procedures / Treatments   EKG None  Procedures Procedures  Medications Ordered in the ED Medications  albuterol (PROVENTIL) (2.5 MG/3ML) 0.083% nebulizer solution 2.5 mg (2.5 mg Nebulization Given 08/12/22 0114)  ipratropium-albuterol (DUONEB) 0.5-2.5 (3) MG/3ML nebulizer solution 3 mL (3 mLs Nebulization Given 08/12/22 0114)  ipratropium-albuterol (DUONEB) 0.5-2.5 (3) MG/3ML nebulizer solution 3 mL (3 mLs Nebulization Given 08/12/22 0135)  albuterol (PROVENTIL) (2.5 MG/3ML) 0.083% nebulizer solution 2.5 mg (2.5 mg Nebulization Given 08/12/22 0134)  methylPREDNISolone sodium succinate (SOLU-MEDROL) 125 mg/2 mL injection 125 mg (125 mg Intravenous Given 08/12/22 0153)  magnesium sulfate IVPB 2 g 50 mL (0 g Intravenous Stopped 08/12/22 0250)    Initial Impression and Plan  Patient here with asthma exacerbation, has had some improvement with nebs given prior to my evaluation but still quite tight. Will give additional neb, magnesium and begin steroids.   ED Course   Clinical Course as of 08/12/22 0327  Sat Aug 12, 2022  1610 Patient feeling much better. Wheezing improved and moving better air. No distress. He is comfortable managing his symptoms at home. Will give a course of prednisone. Recommend Pulmonology follow up. RTED for any other concerns. [CS]    Clinical Course User Index [CS] Pollyann Savoy, MD     MDM Rules/Calculators/A&P Medical Decision Making Given presenting complaint, I  considered that admission might be necessary. After review of results from ED lab and/or imaging studies, admission to the hospital is not indicated at this time.    Problems Addressed: Moderate persistent asthma with acute exacerbation: chronic illness or injury with exacerbation, progression, or side effects of treatment  Risk Prescription drug management. Decision regarding hospitalization.     Final Clinical Impression(s) / ED Diagnoses Final diagnoses:  Moderate persistent asthma with acute exacerbation    Rx / DC Orders ED Discharge Orders          Ordered    predniSONE (STERAPRED UNI-PAK 21 TAB) 10 MG (21) TBPK tablet        08/12/22 0326             Pollyann Savoy, MD 08/12/22 [phone]

## 2022-08-12 NOTE — ED Notes (Signed)
RT assessed pt in triage. Pt respiratory status stable on RA w/no distress noted at this time. Pt BLBS diminished w/chest heaviness/tightness per pt. RT initiated 5/5 per protocol. Pt has not seen pulmonologist since he was a child, no current PFT for reevaluation of asthma. RT will continue to monitor while at [facility] ED.

## 2022-10-10 ENCOUNTER — Encounter (HOSPITAL_BASED_OUTPATIENT_CLINIC_OR_DEPARTMENT_OTHER): Payer: Self-pay

## 2022-10-10 ENCOUNTER — Other Ambulatory Visit: Payer: Self-pay

## 2022-10-10 ENCOUNTER — Emergency Department (HOSPITAL_BASED_OUTPATIENT_CLINIC_OR_DEPARTMENT_OTHER)
Admission: EM | Admit: 2022-10-10 | Discharge: 2022-10-10 | Discharge status: Against Medical Advice | Payer: BC Managed Care – PPO | Attending: Emergency Medicine | Admitting: Emergency Medicine

## 2022-10-10 DIAGNOSIS — Z5321 Procedure and treatment not carried out due to patient leaving prior to being seen by health care provider: Secondary | ICD-10-CM | POA: Insufficient documentation

## 2022-10-10 DIAGNOSIS — R1310 Dysphagia, unspecified: Secondary | ICD-10-CM | POA: Insufficient documentation

## 2022-10-10 DIAGNOSIS — Z91018 Allergy to other foods: Secondary | ICD-10-CM | POA: Diagnosis not present

## 2022-10-10 MED ORDER — PREDNISONE 50 MG PO TABS
60.0000 mg | ORAL_TABLET | Freq: Once | ORAL | Status: AC
  Administered 2022-10-10: 60 mg via ORAL
  Filled 2022-10-10: qty 1

## 2022-10-10 MED ORDER — FAMOTIDINE 20 MG PO TABS
20.0000 mg | ORAL_TABLET | Freq: Once | ORAL | Status: AC
  Administered 2022-10-10: 20 mg via ORAL
  Filled 2022-10-10: qty 1

## 2022-10-10 NOTE — ED Triage Notes (Addendum)
Patient here POV from Home.  Endorses less than 30 minutes ago began to have itchy and swelling to throat area. Trouble swallowing as well. Allergic to nuts. States he was eating and at that time began to have his symptoms.50 mg of Benadryl.   NAD Noted During Triage. A&Ox4. GCS 15. Ambulatory.

## 2022-10-10 NOTE — ED Notes (Signed)
Per registration pt left after triage.

## 2023-08-11 IMAGING — DX DG CHEST 1V PORT
1 series · 2 of 2 positions shown · non-contrast
Comparison: 10/08/2019.
COMPARISON: 10/08/2019.

Addendum:
CLINICAL DATA: Dyspnea, wheezing, shortness of breath.

EXAM:
PORTABLE CHEST 1 VIEW

[Series 1: chest ap · 0.14mm/px · 2 of 2 slices shown]
[im 1/2]
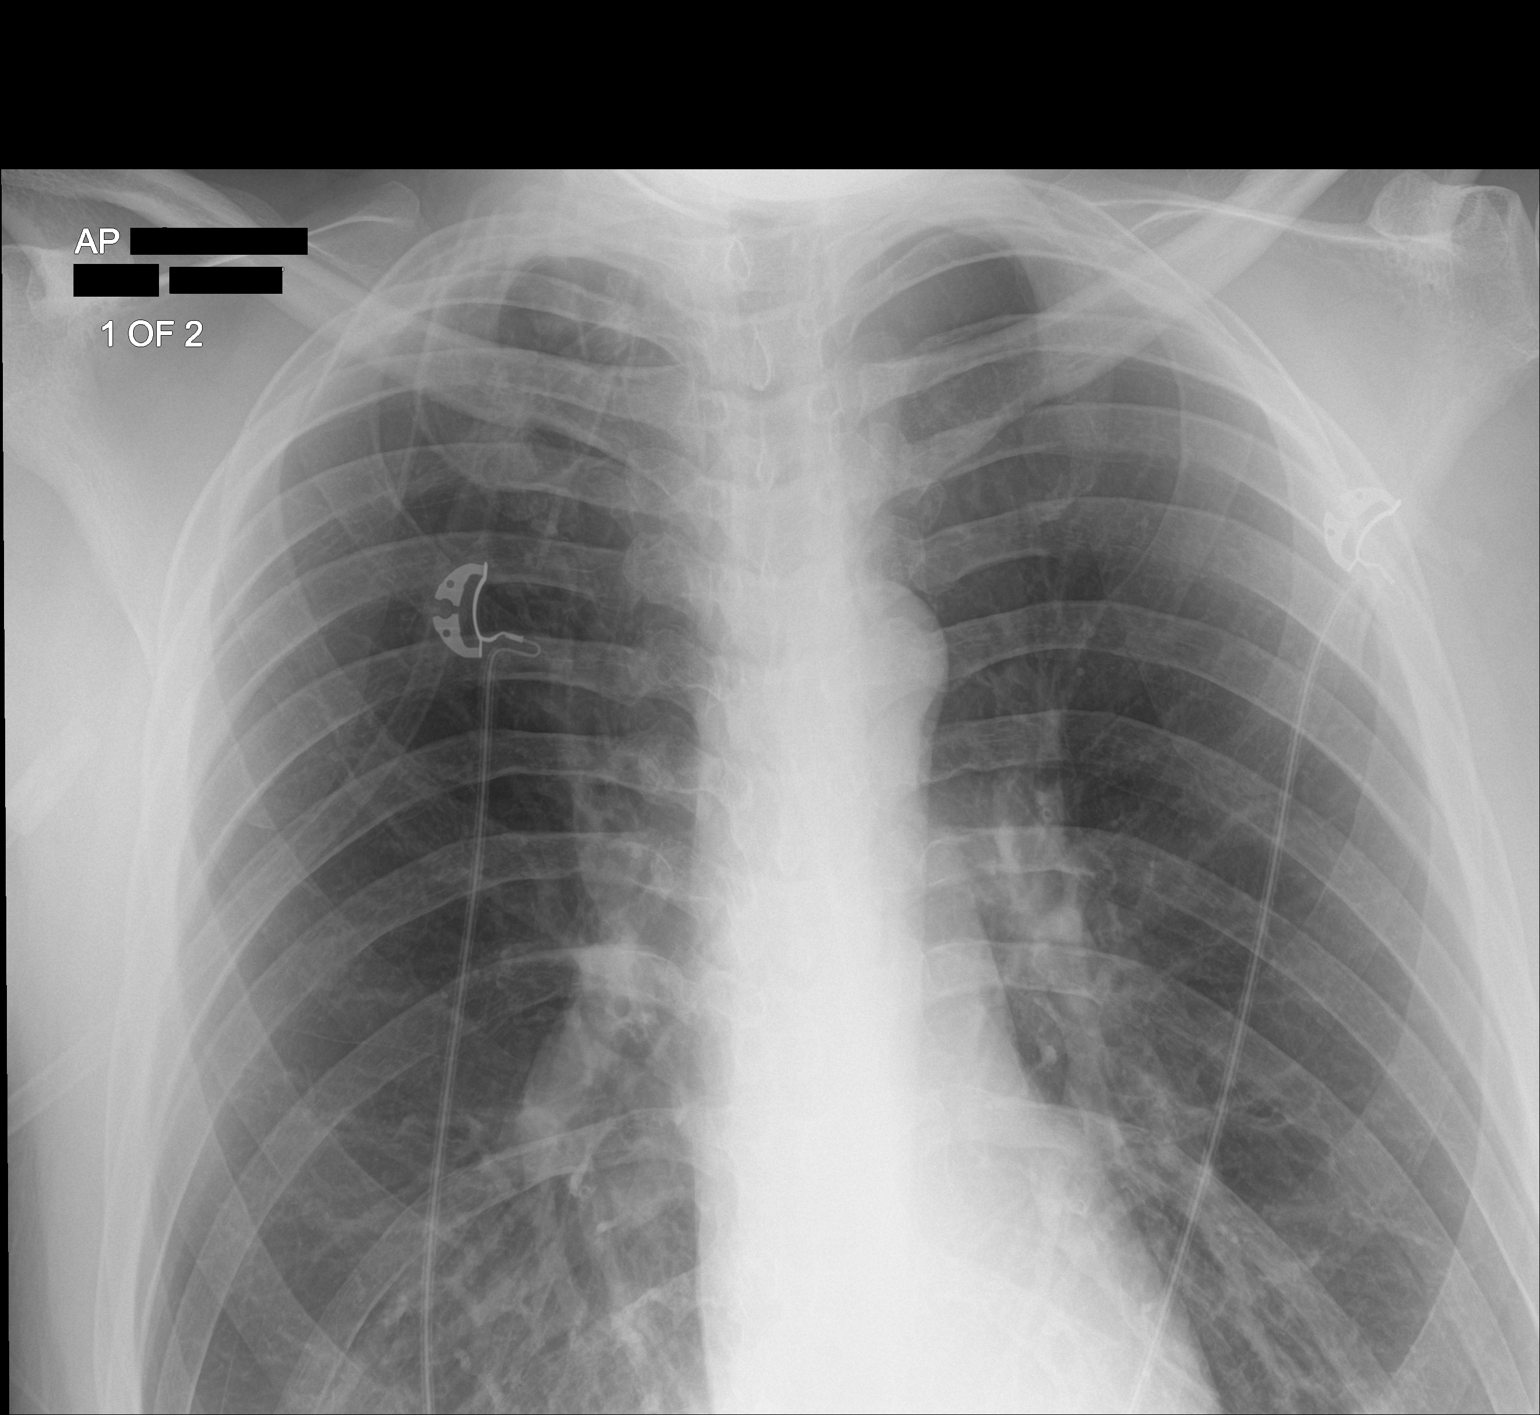
[im 2/2]
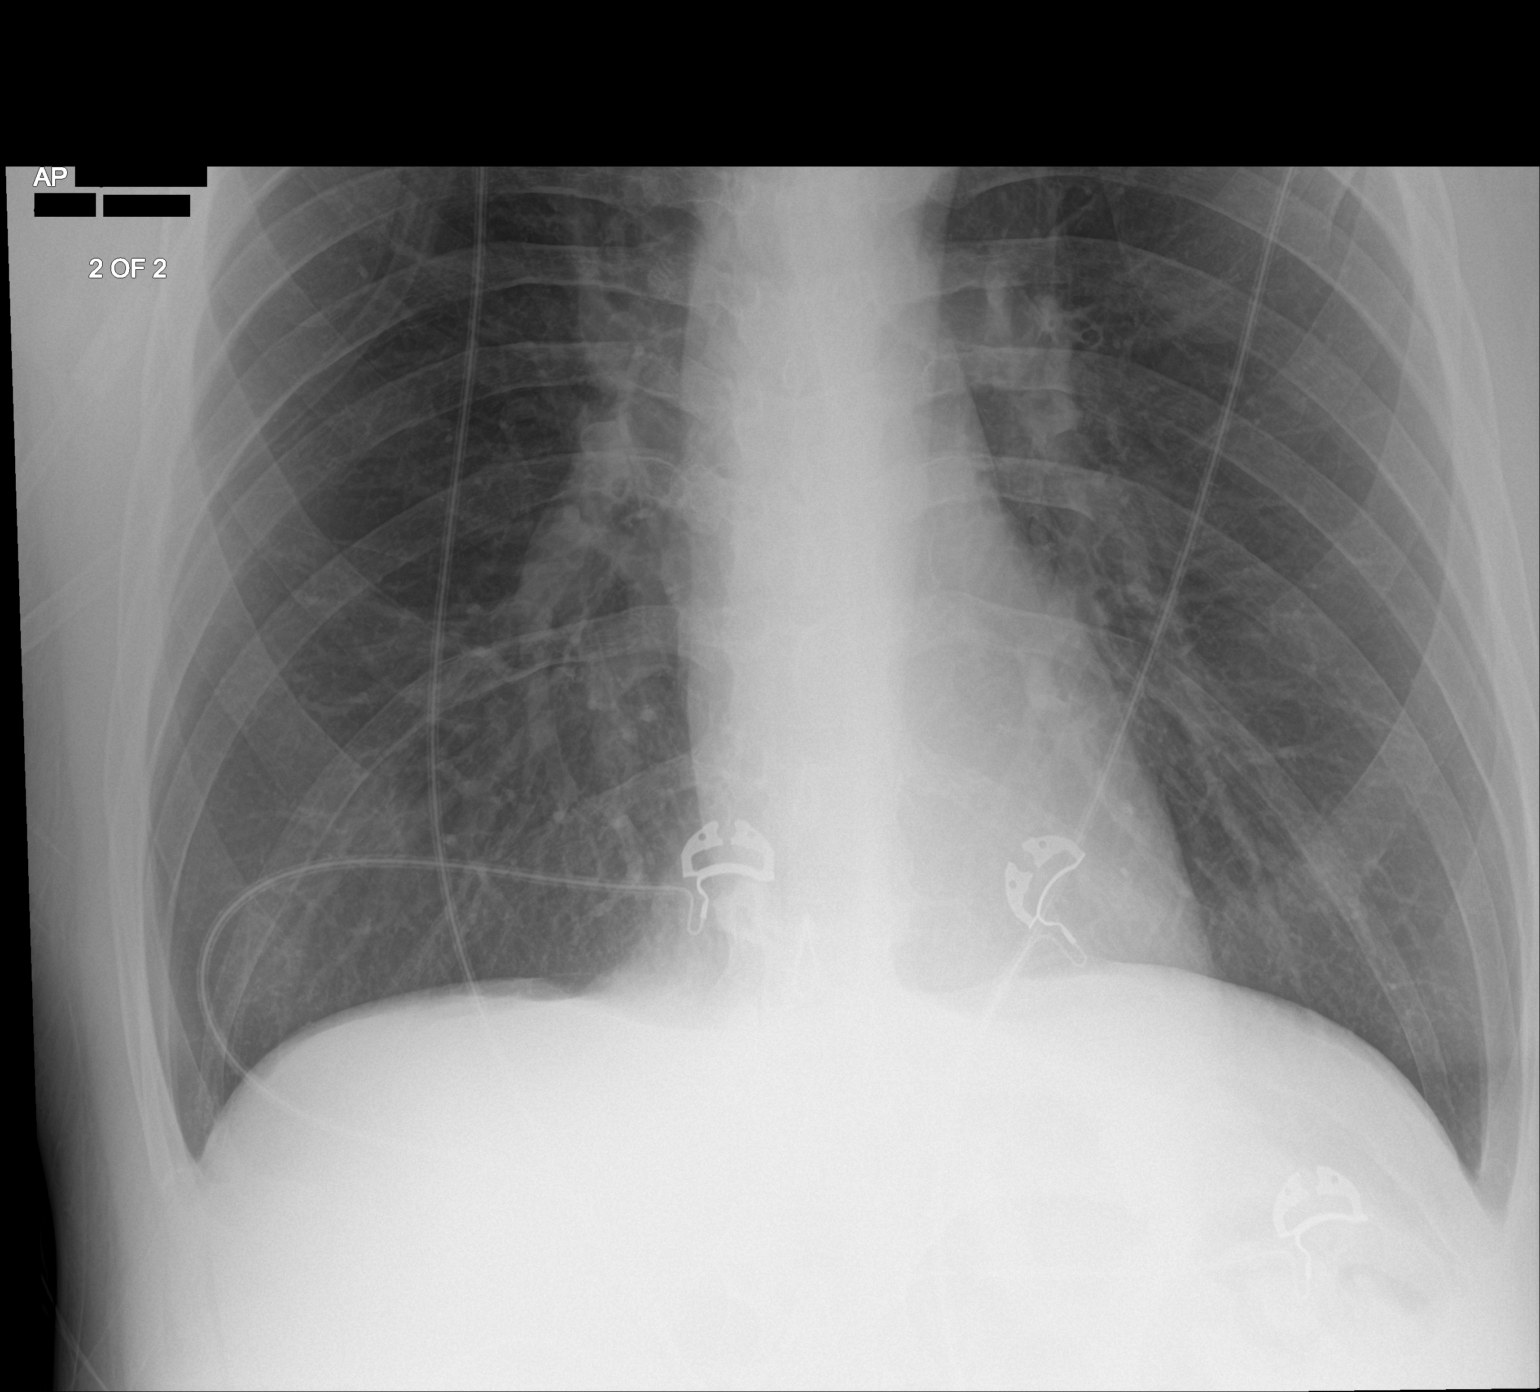

[2 of 2 positions shown; findings below may reference images not displayed]

FINDINGS: The heart size and mediastinal contours are within normal limits.
There is hyperinflation of the lungs. Both lungs are clear. The
visualized skeletal structures are unremarkable.
IMPRESSION: Hyperinflation of the lungs with acute infiltrate.

ADDENDUM:
Voice recognition error noted in the impression section of this
report. The impression sentence should read as follows:
"Hyperinflation of the lungs WITHOUT acute infiltrate."

*** End of Addendum ***
FINDINGS: The heart size and mediastinal contours are within normal limits.
There is hyperinflation of the lungs. Both lungs are clear. The
visualized skeletal structures are unremarkable.
IMPRESSION: Hyperinflation of the lungs with acute infiltrate.
